# Patient Record
Sex: Female | Born: 1980 | Race: White | Hispanic: No | Marital: Married | Smoking: Never smoker
Health system: Southern US, Community
[De-identification: ages and names within clinical notes are randomized; demographics above are authoritative.]

## PROBLEM LIST (undated history)

## (undated) DIAGNOSIS — R51 Headache: Secondary | ICD-10-CM

## (undated) DIAGNOSIS — I10 Essential (primary) hypertension: Secondary | ICD-10-CM

## (undated) DIAGNOSIS — N970 Female infertility associated with anovulation: Secondary | ICD-10-CM

## (undated) HISTORY — PX: OTHER SURGICAL HISTORY: SHX169

## (undated) HISTORY — PX: TONSILLECTOMY: SUR1361

---
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
## 1999-11-18 ENCOUNTER — Encounter: Payer: Self-pay | Admitting: Emergency Medicine

## 1999-11-18 ENCOUNTER — Emergency Department (HOSPITAL_COMMUNITY): Admission: EM | Admit: 1999-11-18 | Discharge: 1999-11-18 | Payer: Self-pay | Admitting: Emergency Medicine

## 2000-10-13 ENCOUNTER — Emergency Department (HOSPITAL_COMMUNITY): Admission: EM | Admit: 2000-10-13 | Discharge: 2000-10-13 | Payer: Self-pay | Admitting: Emergency Medicine

## 2001-06-05 ENCOUNTER — Ambulatory Visit (HOSPITAL_COMMUNITY): Admission: RE | Admit: 2001-06-05 | Discharge: 2001-06-05 | Payer: Self-pay | Admitting: Family Medicine

## 2001-06-05 ENCOUNTER — Encounter: Payer: Self-pay | Admitting: Family Medicine

## 2001-07-02 ENCOUNTER — Other Ambulatory Visit: Admission: RE | Admit: 2001-07-02 | Discharge: 2001-07-02 | Payer: Self-pay | Admitting: Family Medicine

## 2001-08-09 ENCOUNTER — Encounter: Payer: Self-pay | Admitting: Family Medicine

## 2001-08-09 ENCOUNTER — Ambulatory Visit (HOSPITAL_COMMUNITY): Admission: RE | Admit: 2001-08-09 | Discharge: 2001-08-09 | Payer: Self-pay | Admitting: Family Medicine

## 2001-12-26 ENCOUNTER — Inpatient Hospital Stay (HOSPITAL_COMMUNITY): Admission: AD | Admit: 2001-12-26 | Discharge: 2001-12-28 | Payer: Self-pay | Admitting: Family Medicine

## 2004-07-16 ENCOUNTER — Other Ambulatory Visit: Admission: RE | Admit: 2004-07-16 | Discharge: 2004-07-16 | Payer: Self-pay | Admitting: Family Medicine

## 2004-12-31 ENCOUNTER — Emergency Department (HOSPITAL_COMMUNITY): Admission: EM | Admit: 2004-12-31 | Discharge: 2005-01-01 | Payer: Self-pay | Admitting: Emergency Medicine

## 2005-01-06 ENCOUNTER — Emergency Department (HOSPITAL_COMMUNITY): Admission: EM | Admit: 2005-01-06 | Discharge: 2005-01-06 | Payer: Self-pay | Admitting: Emergency Medicine

## 2005-08-08 ENCOUNTER — Emergency Department (HOSPITAL_COMMUNITY): Admission: EM | Admit: 2005-08-08 | Discharge: 2005-08-08 | Payer: Self-pay | Admitting: Emergency Medicine

## 2005-08-09 ENCOUNTER — Emergency Department (HOSPITAL_COMMUNITY): Admission: EM | Admit: 2005-08-09 | Discharge: 2005-08-09 | Payer: Self-pay | Admitting: Emergency Medicine

## 2005-11-10 ENCOUNTER — Emergency Department (HOSPITAL_COMMUNITY): Admission: EM | Admit: 2005-11-10 | Discharge: 2005-11-10 | Payer: Self-pay | Admitting: Family Medicine

## 2005-11-18 ENCOUNTER — Emergency Department (HOSPITAL_COMMUNITY): Admission: EM | Admit: 2005-11-18 | Discharge: 2005-11-18 | Payer: Self-pay | Admitting: Family Medicine

## 2005-12-25 ENCOUNTER — Emergency Department (HOSPITAL_COMMUNITY): Admission: EM | Admit: 2005-12-25 | Discharge: 2005-12-26 | Payer: Self-pay | Admitting: Emergency Medicine

## 2006-02-08 ENCOUNTER — Ambulatory Visit (HOSPITAL_BASED_OUTPATIENT_CLINIC_OR_DEPARTMENT_OTHER): Admission: RE | Admit: 2006-02-08 | Discharge: 2006-02-09 | Payer: Self-pay | Admitting: Otolaryngology

## 2006-02-08 ENCOUNTER — Encounter (INDEPENDENT_AMBULATORY_CARE_PROVIDER_SITE_OTHER): Payer: Self-pay | Admitting: Specialist

## 2006-03-20 ENCOUNTER — Other Ambulatory Visit: Admission: RE | Admit: 2006-03-20 | Discharge: 2006-03-20 | Payer: Self-pay | Admitting: Gynecology

## 2006-06-09 ENCOUNTER — Emergency Department (HOSPITAL_COMMUNITY): Admission: EM | Admit: 2006-06-09 | Discharge: 2006-06-09 | Payer: Self-pay | Admitting: Emergency Medicine

## 2006-07-06 ENCOUNTER — Emergency Department (HOSPITAL_COMMUNITY): Admission: EM | Admit: 2006-07-06 | Discharge: 2006-07-06 | Payer: Self-pay | Admitting: Emergency Medicine

## 2006-07-06 ENCOUNTER — Emergency Department (HOSPITAL_COMMUNITY): Admission: EM | Admit: 2006-07-06 | Discharge: 2006-07-06 | Payer: Self-pay | Admitting: Family Medicine

## 2006-10-31 ENCOUNTER — Emergency Department (HOSPITAL_COMMUNITY): Admission: EM | Admit: 2006-10-31 | Discharge: 2006-10-31 | Payer: Self-pay | Admitting: Emergency Medicine

## 2006-11-22 ENCOUNTER — Emergency Department (HOSPITAL_COMMUNITY): Admission: EM | Admit: 2006-11-22 | Discharge: 2006-11-22 | Payer: Self-pay | Admitting: Family Medicine

## 2006-12-03 ENCOUNTER — Emergency Department (HOSPITAL_COMMUNITY): Admission: EM | Admit: 2006-12-03 | Discharge: 2006-12-03 | Payer: Self-pay | Admitting: Family Medicine

## 2007-03-15 ENCOUNTER — Other Ambulatory Visit: Admission: RE | Admit: 2007-03-15 | Discharge: 2007-03-15 | Payer: Self-pay | Admitting: Family Medicine

## 2007-07-13 ENCOUNTER — Emergency Department (HOSPITAL_COMMUNITY): Admission: EM | Admit: 2007-07-13 | Discharge: 2007-07-13 | Payer: Self-pay | Admitting: Family Medicine

## 2007-09-08 ENCOUNTER — Emergency Department (HOSPITAL_COMMUNITY): Admission: EM | Admit: 2007-09-08 | Discharge: 2007-09-08 | Payer: Self-pay | Admitting: Emergency Medicine

## 2007-10-29 ENCOUNTER — Emergency Department (HOSPITAL_COMMUNITY): Admission: EM | Admit: 2007-10-29 | Discharge: 2007-10-29 | Payer: Self-pay | Admitting: Family Medicine

## 2007-10-30 ENCOUNTER — Emergency Department (HOSPITAL_COMMUNITY): Admission: EM | Admit: 2007-10-30 | Discharge: 2007-10-30 | Payer: Self-pay | Admitting: Emergency Medicine

## 2007-10-30 ENCOUNTER — Ambulatory Visit (HOSPITAL_COMMUNITY): Admission: RE | Admit: 2007-10-30 | Discharge: 2007-10-30 | Payer: Self-pay | Admitting: Family Medicine

## 2008-01-21 ENCOUNTER — Encounter: Admission: RE | Admit: 2008-01-21 | Discharge: 2008-01-21 | Payer: Self-pay | Admitting: Family Medicine

## 2008-03-07 ENCOUNTER — Emergency Department (HOSPITAL_COMMUNITY): Admission: EM | Admit: 2008-03-07 | Discharge: 2008-03-07 | Payer: Self-pay | Admitting: Emergency Medicine

## 2008-03-12 ENCOUNTER — Emergency Department (HOSPITAL_COMMUNITY): Admission: EM | Admit: 2008-03-12 | Discharge: 2008-03-12 | Payer: Self-pay | Admitting: Emergency Medicine

## 2008-08-17 ENCOUNTER — Emergency Department (HOSPITAL_COMMUNITY): Admission: EM | Admit: 2008-08-17 | Discharge: 2008-08-17 | Payer: Self-pay | Admitting: Emergency Medicine

## 2008-10-24 ENCOUNTER — Emergency Department (HOSPITAL_COMMUNITY): Admission: EM | Admit: 2008-10-24 | Discharge: 2008-10-24 | Payer: Self-pay | Admitting: Emergency Medicine

## 2009-01-22 ENCOUNTER — Emergency Department (HOSPITAL_COMMUNITY): Admission: EM | Admit: 2009-01-22 | Discharge: 2009-01-22 | Payer: Self-pay | Admitting: Family Medicine

## 2009-03-13 ENCOUNTER — Emergency Department (HOSPITAL_COMMUNITY): Admission: EM | Admit: 2009-03-13 | Discharge: 2009-03-13 | Payer: Self-pay | Admitting: Family Medicine

## 2009-05-22 ENCOUNTER — Emergency Department (HOSPITAL_COMMUNITY): Admission: EM | Admit: 2009-05-22 | Discharge: 2009-05-23 | Payer: Self-pay | Admitting: Emergency Medicine

## 2009-07-16 ENCOUNTER — Emergency Department (HOSPITAL_COMMUNITY): Admission: EM | Admit: 2009-07-16 | Discharge: 2009-07-17 | Payer: Self-pay | Admitting: Emergency Medicine

## 2009-10-08 ENCOUNTER — Encounter: Admission: RE | Admit: 2009-10-08 | Discharge: 2009-10-08 | Payer: Self-pay | Admitting: Obstetrics and Gynecology

## 2010-01-03 NOTE — L&D Delivery Note (Addendum)
Twin A is FEMALE --  Twin B is FEMALE

## 2010-01-24 ENCOUNTER — Encounter: Payer: Self-pay | Admitting: Orthopedic Surgery

## 2010-03-20 LAB — COMPREHENSIVE METABOLIC PANEL
AST: 20 U/L (ref 0–37)
Albumin: 4.1 g/dL (ref 3.5–5.2)
Alkaline Phosphatase: 76 U/L (ref 39–117)
BUN: 10 mg/dL (ref 6–23)
CO2: 23 mEq/L (ref 19–32)
Creatinine, Ser: 0.77 mg/dL (ref 0.4–1.2)
GFR calc non Af Amer: >60 mL/min (ref >60)
Glucose, Bld: 94 mg/dL (ref 70–99)
Potassium: 3.5 mEq/L (ref 3.5–5.1)
Sodium: 138 mEq/L (ref 135–145)

## 2010-03-20 LAB — CBC
Hemoglobin: 13.6 g/dL (ref 12.0–15.0)
MCH: 31.5 pg (ref 26.0–34.0)
MCHC: 34.5 g/dL (ref 30.0–36.0)
RBC: 4.3 MIL/uL (ref 3.87–5.11)

## 2010-03-20 LAB — HEMOCCULT GUIAC POC 1CARD (OFFICE): Fecal Occult Bld: POSITIVE

## 2010-03-20 LAB — DIFFERENTIAL
Eosinophils Absolute: 0.1 10*3/uL (ref 0.0–0.7)
Eosinophils Relative: 1 % (ref 0–5)
Lymphocytes Relative: 23 % (ref 12–46)
Lymphs Abs: 1.9 10*3/uL (ref 0.7–4.0)
Monocytes Relative: 6 % (ref 3–12)
Neutrophils Relative %: 69 % (ref 43–77)

## 2010-03-22 LAB — CBC
HCT: 38 % (ref 36.0–46.0)
Hemoglobin: 13 g/dL (ref 12.0–15.0)
RBC: 4.16 MIL/uL (ref 3.87–5.11)
WBC: 6.7 10*3/uL (ref 4.0–10.5)

## 2010-03-22 LAB — COMPREHENSIVE METABOLIC PANEL
AST: 18 U/L (ref 0–37)
Albumin: 4 g/dL (ref 3.5–5.2)
Alkaline Phosphatase: 73 U/L (ref 39–117)
CO2: 28 mEq/L (ref 19–32)
Calcium: 9.1 mg/dL (ref 8.4–10.5)
Chloride: 103 mEq/L (ref 96–112)
Creatinine, Ser: 0.89 mg/dL (ref 0.4–1.2)
GFR calc non Af Amer: >60 mL/min (ref >60)
Glucose, Bld: 103 mg/dL — ABNORMAL HIGH (ref 70–99)
Potassium: 4.1 mEq/L (ref 3.5–5.1)
Total Protein: 7.1 g/dL (ref 6.0–8.3)

## 2010-03-22 LAB — URINALYSIS, ROUTINE W REFLEX MICROSCOPIC
Bilirubin Urine: NEGATIVE
Ketones, ur: NEGATIVE mg/dL
Nitrite: NEGATIVE
Urobilinogen, UA: 0.2 mg/dL (ref 0.0–1.0)

## 2010-03-22 LAB — DIFFERENTIAL
Basophils Absolute: 0 10*3/uL (ref 0.0–0.1)
Basophils Relative: 0 % (ref 0–1)
Eosinophils Absolute: 0.1 10*3/uL (ref 0.0–0.7)
Eosinophils Relative: 1 % (ref 0–5)
Lymphocytes Relative: 41 % (ref 12–46)
Monocytes Absolute: 0.4 10*3/uL (ref 0.1–1.0)

## 2010-03-28 LAB — POCT PREGNANCY, URINE: Preg Test, Ur: NEGATIVE

## 2010-04-05 ENCOUNTER — Other Ambulatory Visit (HOSPITAL_COMMUNITY): Payer: Self-pay | Admitting: Obstetrics and Gynecology

## 2010-04-05 ENCOUNTER — Other Ambulatory Visit (HOSPITAL_COMMUNITY): Payer: Self-pay | Admitting: Obstetrics & Gynecology

## 2010-04-05 DIAGNOSIS — N979 Female infertility, unspecified: Secondary | ICD-10-CM

## 2010-04-08 LAB — POCT URINALYSIS DIP (DEVICE): Hgb urine dipstick: NEGATIVE

## 2010-04-08 LAB — POCT PREGNANCY, URINE: Preg Test, Ur: NEGATIVE

## 2010-04-13 ENCOUNTER — Ambulatory Visit (HOSPITAL_COMMUNITY)
Admission: RE | Admit: 2010-04-13 | Discharge: 2010-04-13 | Disposition: A | Discharge status: Home / Self Care | Payer: 59 | Source: Ambulatory Visit | Attending: Obstetrics & Gynecology | Admitting: Obstetrics & Gynecology

## 2010-04-13 DIAGNOSIS — N979 Female infertility, unspecified: Secondary | ICD-10-CM | POA: Insufficient documentation

## 2010-04-26 ENCOUNTER — Inpatient Hospital Stay (INDEPENDENT_AMBULATORY_CARE_PROVIDER_SITE_OTHER)
Admission: RE | Admit: 2010-04-26 | Discharge: 2010-04-26 | Disposition: A | Discharge status: Home / Self Care | Payer: 59 | Source: Ambulatory Visit

## 2010-04-26 DIAGNOSIS — J019 Acute sinusitis, unspecified: Secondary | ICD-10-CM

## 2010-04-26 DIAGNOSIS — J029 Acute pharyngitis, unspecified: Secondary | ICD-10-CM

## 2010-04-26 LAB — POCT RAPID STREP A (OFFICE): Streptococcus, Group A Screen (Direct): NEGATIVE

## 2010-05-21 NOTE — Op Note (Signed)
NAME:  [name], [name] NO.:  [id number]   MEDICAL RECORD NO.:  [id number]          PATIENT TYPE:  AMB   LOCATION:  DSC                          FACILITY:  [facility]   PHYSICIAN:  Antony Contras, MD     DATE OF BIRTH:  [birth year]   DATE OF PROCEDURE:  02/08/2006  DATE OF DISCHARGE:  02/09/2006                               OPERATIVE REPORT   PREOPERATIVE DIAGNOSIS:  Chronic tonsillitis.   POSTOPERATIVE DIAGNOSIS:  Chronic tonsillitis.   PROCEDURE PERFORMED:  Tonsillectomy.   SURGEON:  Antony Contras, MD.   ANESTHESIA:  General endotracheal anesthesia.   COMPLICATIONS:  None.   INDICATIONS FOR PROCEDURE:  The patient is a 30 year old white female  who has had recurrent tonsil infections for the past five years that  number about five times per year.  She recently had another infection  and presents to the operating room for surgical management.   FINDINGS:  Tonsils are 1 to 2+ in size, and not overtly inflamed today.   DESCRIPTION OF PROCEDURE:  The patient was identified in the holding  room and, informed consent having been obtained from the patient, the  patient was moved to the operative suite and put onto the table in the  supine position.  Anesthesia was induced and the patient was intubated  by the anesthesia team without difficulty.  The patient was given  intravenous antibiotics and steroids during the case.  The eyes were  taped closed, and the bed was turned 90 degrees from anesthesia.  A head  wrap was placed around the patient's head.  The oropharynx was then  exposed using a Crowe-Davis retractor that was placed in suspension on  the Mayo stand.  The right tonsil was grasped with a curved Allis and  retracted medially.  A curvilinear incision was made along the anterior  tonsillar pillar using the Coblator on a setting of 7 and 3.  This was  continued in the sub capsular plane until a tonsil was removed.  This  was passed to nursing for pathology.   The same procedure was then  carried on the left side.  The tonsils were passed separately for  pathology.   Suction cautery on a setting of 35 was then used to cauterize bleeding  vessels in both tonsillar fossae as well as superficial vessels.  After  this was completed, the throat and nose were copiously irrigated with  saline and a suction  catheter was passed down the esophagus to suck out the stomach and  esophagus.  The retractor was then taken out of suspension and removed  from the patient's mouth.  She was then turned back to anesthesia,  awakened, and was extubated and moved to the recovery room in stable  condition.      Antony Contras, MD  Electronically Signed     DDB/MEDQ  D:  02/08/2006  T:  02/09/2006  Job:  [id number]

## 2010-07-01 ENCOUNTER — Observation Stay (HOSPITAL_COMMUNITY): Payer: 59

## 2010-07-01 ENCOUNTER — Observation Stay (HOSPITAL_COMMUNITY)
Admission: AD | Admit: 2010-07-01 | Discharge: 2010-07-01 | Disposition: A | Discharge status: Home / Self Care | Payer: 59 | Source: Ambulatory Visit | Attending: Obstetrics and Gynecology | Admitting: Obstetrics and Gynecology

## 2010-07-01 DIAGNOSIS — O30049 Twin pregnancy, dichorionic/diamniotic, unspecified trimester: Secondary | ICD-10-CM | POA: Insufficient documentation

## 2010-07-01 DIAGNOSIS — O210 Mild hyperemesis gravidarum: Principal | ICD-10-CM | POA: Insufficient documentation

## 2010-07-01 DIAGNOSIS — O30009 Twin pregnancy, unspecified number of placenta and unspecified number of amniotic sacs, unspecified trimester: Secondary | ICD-10-CM | POA: Insufficient documentation

## 2010-07-01 LAB — URINALYSIS, ROUTINE W REFLEX MICROSCOPIC
Bilirubin Urine: NEGATIVE
Nitrite: NEGATIVE
Specific Gravity, Urine: >1.03 — ABNORMAL HIGH (ref 1.005–1.030)
Urobilinogen, UA: 0.2 mg/dL (ref 0.0–1.0)

## 2010-07-07 ENCOUNTER — Inpatient Hospital Stay (HOSPITAL_COMMUNITY)
Admission: AD | Admit: 2010-07-07 | Discharge: 2010-07-07 | Disposition: A | Discharge status: Home / Self Care | Payer: 59 | Source: Ambulatory Visit | Attending: Obstetrics & Gynecology | Admitting: Obstetrics & Gynecology

## 2010-07-07 ENCOUNTER — Inpatient Hospital Stay (HOSPITAL_COMMUNITY): Payer: 59

## 2010-07-07 DIAGNOSIS — O30049 Twin pregnancy, dichorionic/diamniotic, unspecified trimester: Secondary | ICD-10-CM | POA: Insufficient documentation

## 2010-07-07 DIAGNOSIS — O30009 Twin pregnancy, unspecified number of placenta and unspecified number of amniotic sacs, unspecified trimester: Secondary | ICD-10-CM | POA: Insufficient documentation

## 2010-07-07 DIAGNOSIS — O210 Mild hyperemesis gravidarum: Secondary | ICD-10-CM | POA: Insufficient documentation

## 2010-07-07 LAB — COMPREHENSIVE METABOLIC PANEL
Albumin: 3.2 g/dL — ABNORMAL LOW (ref 3.5–5.2)
Alkaline Phosphatase: 60 U/L (ref 39–117)
BUN: 7 mg/dL (ref 6–23)
Calcium: 9 mg/dL (ref 8.4–10.5)
Potassium: 3.9 mEq/L (ref 3.5–5.1)
Sodium: 135 mEq/L (ref 135–145)
Total Protein: 7 g/dL (ref 6.0–8.3)

## 2010-07-07 LAB — URINALYSIS, ROUTINE W REFLEX MICROSCOPIC
Glucose, UA: NEGATIVE mg/dL
Ketones, ur: NEGATIVE mg/dL
Leukocytes, UA: NEGATIVE
Specific Gravity, Urine: 1.015 (ref 1.005–1.030)
pH: 6 (ref 5.0–8.0)

## 2010-07-08 LAB — ABO/RH: "RH Type ": POSITIVE

## 2010-07-08 LAB — RUBELLA ANTIBODY, IGM: Rubella: IMMUNE

## 2010-07-08 LAB — HEPATITIS B SURFACE ANTIGEN: Hepatitis B Surface Ag: NEGATIVE

## 2010-07-08 LAB — SYPHILIS: RPR W/REFLEX TO RPR TITER AND TREPONEMAL ANTIBODIES, TRADITIONAL SCREENING AND DIAGNOSIS ALGORITHM: RPR: NONREACTIVE

## 2010-07-09 ENCOUNTER — Other Ambulatory Visit: Payer: Self-pay | Admitting: Obstetrics & Gynecology

## 2010-07-09 NOTE — H&P (Signed)
  NAMEMICHEALE, SCHLACK NO.:  [id number]  MEDICAL RECORD NO.:  [id number]  LOCATION:                                 FACILITY:  PHYSICIAN:  Lenoard Aden, M.D.DATE OF BIRTH:  [birth year]  DATE OF ADMISSION: DATE OF DISCHARGE:                             HISTORY & PHYSICAL   CHIEF COMPLAINT:  Nausea, vomiting.  HISTORY OF PRESENT ILLNESS:  She is a 30 year old white female G2, P1 with a known twin dichorionic diamniotic gestation who presents with recurrent persistent nausea, vomiting despite p.r.n. medication use.  FAMILY HISTORY:  Breast cancer.  SOCIAL HISTORY:  Nonsmoker, nondrinker.  Denies domestic or physical violence.  OBSTETRICAL HISTORY:  Remarkable for vaginal delivery.  PAST SURGICAL HISTORY:  History of toe amputation, history of tonsillectomy.  ALLERGIES:  TRAMADOL.  MEDICATIONS:  Her home medications include Zofran and Phenergan and Bonine p.r.n.  She has not had any p.o. intake reportedly for 24-48 hours.  Zofran, prenatal vitamins, Bonine, promethazine, and Zantac.  PHYSICAL EXAMINATION:  GENERAL:  She is a well-developed, well-nourished white female in no acute distress. VITAL SIGNS:  Stable.  The patient is afebrile. HEENT:  Normal.  Neck is supple, full range of motion. LUNGS:  Clear. HEART:  Reveals a regular rate rhythm. ABDOMEN:  Soft, gravid, just above pubis, but nontender. PELVIC:  Deferred. EXTREMITIES:  Revealed no cords. NEUROLOGIC:  Nonfocal. SKIN:  Intact.  IMPRESSION: 1. A 11-week twin dichorionic diamniotic gestation, previously done     ultrasound. 2. Refractory hyperemesis.  PLAN:  To admit, IV fluids, IV Zofran, CMP, with discharge pending.     Lenoard Aden, M.D.     RJT/MEDQ  D:  07/07/2010  T:  07/08/2010  Job:  [id number]  Electronically Signed by Olivia Mackie M.D. on 07/09/2010 07:37:26 AM

## 2010-07-09 NOTE — H&P (Signed)
  [name], [name] NO.:  [id number]  MEDICAL RECORD NO.:  [id number]  LOCATION:                                 FACILITY:  PHYSICIAN:  Lenoard Aden, M.D.DATE OF BIRTH:  [birth year]  DATE OF ADMISSION:  07/01/2010 DATE OF DISCHARGE:                             HISTORY & PHYSICAL   CHIEF COMPLAINT:  Nausea and vomiting.  She is a 30 year old white female, G2, P1 with a known twin di-di pregnancy, who presents with nausea, vomiting.  Her medications are Zofran p.r.n., Phenergan p.r.n., and prenatal vitamins.  She is a nonsmoker, nondrinker.  Denies domestic or physical violence.  She has a history of vaginal delivery, history of toe amputation, and history of tonsillectomy.  FAMILY HISTORY:  Breast cancer.  ALLERGIES:  She has an allergy to TRAMADOL as noted.  PHYSICAL EXAM:  GENERAL:  She is a well-developed, well-nourished white female, in no acute distress. HEENT:  Normal. NECK:  Supple.  Full range of motion. LUNGS:  Clear. HEART:  Regular rate and rhythm. ABDOMEN:  Soft, gravid, nontender. PELVIC:  Deferred. EXTREMITIES:  There are no cords. NEUROLOGIC:  Nonfocal. SKIN:  Intact.  IMPRESSION: 1. A 9-week intrauterine pregnancy, documented by my ultrasound today. 2. Hyperemesis.  PLAN:  Proceed with IV fluid, IV Zofran, Phenergan, Zantac p.o. Discharge home after improvement.  Follow up in the office as scheduled.     Lenoard Aden, M.D.    RJT/MEDQ  D:  07/01/2010  T:  07/02/2010  Job:  [id number]  Electronically Signed by Olivia Mackie M.D. on 07/09/2010 07:36:51 AM

## 2010-07-15 ENCOUNTER — Inpatient Hospital Stay (HOSPITAL_COMMUNITY)
Admission: AD | Admit: 2010-07-15 | Discharge: 2010-07-15 | Disposition: A | Discharge status: Home / Self Care | Payer: 59 | Source: Ambulatory Visit | Attending: Obstetrics & Gynecology | Admitting: Obstetrics & Gynecology

## 2010-07-15 ENCOUNTER — Encounter (HOSPITAL_COMMUNITY): Payer: Self-pay

## 2010-07-15 ENCOUNTER — Inpatient Hospital Stay (HOSPITAL_COMMUNITY): Admission: AD | Admit: 2010-07-15 | Payer: 59 | Source: Ambulatory Visit | Admitting: Obstetrics & Gynecology

## 2010-07-15 DIAGNOSIS — O30049 Twin pregnancy, dichorionic/diamniotic, unspecified trimester: Secondary | ICD-10-CM | POA: Insufficient documentation

## 2010-07-15 DIAGNOSIS — O210 Mild hyperemesis gravidarum: Secondary | ICD-10-CM | POA: Insufficient documentation

## 2010-07-15 DIAGNOSIS — O30009 Twin pregnancy, unspecified number of placenta and unspecified number of amniotic sacs, unspecified trimester: Secondary | ICD-10-CM | POA: Insufficient documentation

## 2010-07-15 HISTORY — DX: Female infertility associated with anovulation: N97.0

## 2010-07-15 MED ORDER — KCL-LACTATED RINGERS-D5W 20 MEQ/L IV SOLN
Freq: Once | INTRAVENOUS | Status: AC
  Administered 2010-07-15: 15:00:00 via INTRAVENOUS
  Filled 2010-07-15 (×2): qty 1000

## 2010-07-15 MED ORDER — SODIUM CHLORIDE 0.9 % IV SOLN: 1.0000 mg | Freq: Once | INTRAVENOUS | Status: DC

## 2010-07-15 MED ORDER — M.V.I. ADULT IV INJ: 10.0000 mL | Freq: Once | INTRAVENOUS | Status: DC

## 2010-07-15 MED ORDER — THIAMINE HCL 100 MG/ML IJ SOLN
Freq: Once | INTRAVENOUS | Status: AC
  Administered 2010-07-15: 19:00:00 via INTRAVENOUS
  Filled 2010-07-15: qty 1000

## 2010-07-15 MED ORDER — METOCLOPRAMIDE HCL 5 MG/ML IJ SOLN
10.0000 mg | Freq: Once | INTRAMUSCULAR | Status: AC
  Administered 2010-07-15: 10 mg via INTRAVENOUS
  Filled 2010-07-15: qty 2

## 2010-07-15 MED ORDER — THIAMINE HCL 100 MG/ML IJ SOLN: 100.0000 mg | Freq: Once | INTRAMUSCULAR | Status: DC

## 2010-07-15 NOTE — Progress Notes (Signed)
PT REQUEST TO GO HOME- SAYS SHE FEELS BETTER- AND NEEDS TO GO PICK UP SON AND GO TO PHARMACY.  CALLED DR LAVOIE -  NO ANSWER- CALLED L/D AND OR

## 2010-07-15 NOTE — ED Provider Notes (Signed)
History     Chief Complaint  Patient presents with  . Hyperemesis Gravidarum   HPI  10 wks with [name] twins, trying all meds for hyperemesis, not able to eat/drink x 48 hrs, everything is coming back out, no void since > 6-8 hrs. Feels tired, fatigued, HA. Teary. Seen in office and sent for IVF, PO challenge.    Past Medical History  Diagnosis Date  . Infertility associated with anovulation     Past Surgical History  Procedure Date  . Tonsillectomy   . Partial amputation of rt great toe     History reviewed. No pertinent family history.  History  Substance Use Topics  . Smoking status: Never Smoker   . Smokeless tobacco: Never Used  . Alcohol Use: No    Allergies:  Allergies  Allergen Reactions  . Tramadol Nausea And Vomiting    No prescriptions prior to admission    ROS Fatigue. Dizzy, depressed (excited to be preg, but cannot think past n/v)   Physical Exam   Blood pressure 102/78, pulse 84, temperature 98.2 F (36.8 C), temperature source Oral, resp. rate 18, height 5\' 2"  (1.575 m), weight 79.017 kg (174 lb 3.2 oz), last menstrual period 04/05/2010.  Physical Exam HEENT neg, A&O x 3 Lungs CTA b/l CV RRR Abdo soft, NABS.  Extr no c/c/e FHT x 2 in office.   MAU Course  Procedures Recvd 10 mg IV Reglan, 1 lit D5LR +20K, and 1 lit LR w/ vitamins (Banana bag). Able to tolerate crackers and juice. Feels much better and will dc home  Rx Reglan 10 mg ODT given. Cont all other meds and cut back on Zofran due to constipation.   Mary Telleria, MD

## 2010-07-15 NOTE — Progress Notes (Signed)
Sent from MD office for hyperemesis, was here last week, to receive ivfluids. Denies diarrhea, no bleeding or vag d/c changes

## 2010-07-15 NOTE — Progress Notes (Signed)
DR Seymour Bars IN MEETING- CALLED DR MODY- SHE'S COMING TO SEE PT.

## 2010-07-15 NOTE — ED Notes (Signed)
Feeling better.  IV infusing well, no complaints of  Pain at site. No redness or swelling at site.  Warm blanket given.  Call light remains in reach.

## 2010-08-16 ENCOUNTER — Inpatient Hospital Stay (HOSPITAL_COMMUNITY)
Admission: AD | Admit: 2010-08-16 | Discharge: 2010-08-17 | Disposition: A | Discharge status: Home / Self Care | Payer: 59 | Source: Ambulatory Visit | Attending: Obstetrics and Gynecology | Admitting: Obstetrics and Gynecology

## 2010-08-16 ENCOUNTER — Encounter (HOSPITAL_COMMUNITY): Payer: Self-pay | Admitting: *Deleted

## 2010-08-16 DIAGNOSIS — O210 Mild hyperemesis gravidarum: Secondary | ICD-10-CM | POA: Insufficient documentation

## 2010-08-16 DIAGNOSIS — G43909 Migraine, unspecified, not intractable, without status migrainosus: Secondary | ICD-10-CM | POA: Insufficient documentation

## 2010-08-16 DIAGNOSIS — O30009 Twin pregnancy, unspecified number of placenta and unspecified number of amniotic sacs, unspecified trimester: Secondary | ICD-10-CM | POA: Insufficient documentation

## 2010-08-16 LAB — URINALYSIS, ROUTINE W REFLEX MICROSCOPIC
Bilirubin Urine: NEGATIVE
Leukocytes, UA: NEGATIVE
Nitrite: NEGATIVE
Specific Gravity, Urine: >1.03 — ABNORMAL HIGH (ref 1.005–1.030)
Urobilinogen, UA: 0.2 mg/dL (ref 0.0–1.0)
pH: 6 (ref 5.0–8.0)

## 2010-08-16 NOTE — Progress Notes (Signed)
VOMITING- AND NOW CRAMPS

## 2010-08-16 NOTE — Progress Notes (Signed)
SAYS WAS NOT SICK YESTERDAY AND THIS MORNIN- WENT TO WORK-   AT 1130-  AT WORK-  STARTED VOMITING-  WORKED  TILL 3PM..  DID NOT TAKE ANY MED.  NOW HAS BAD H/A.   CALLED ON CALL PERSON- TOLD TO COME HERE.

## 2010-08-17 LAB — URINALYSIS, ROUTINE W REFLEX MICROSCOPIC
Bilirubin Urine: NEGATIVE
Ketones, ur: NEGATIVE mg/dL
Leukocytes, UA: NEGATIVE
Nitrite: NEGATIVE
Nitrite: NEGATIVE
Protein, ur: NEGATIVE mg/dL
Protein, ur: NEGATIVE mg/dL
Specific Gravity, Urine: 1.02 (ref 1.005–1.030)
Urobilinogen, UA: 0.2 mg/dL (ref 0.0–1.0)
Urobilinogen, UA: 0.2 mg/dL (ref 0.0–1.0)
pH: 7 (ref 5.0–8.0)

## 2010-08-17 LAB — URINE MICROSCOPIC-ADD ON

## 2010-08-17 MED ORDER — SODIUM CHLORIDE 0.9 % IV SOLN
8.0000 mg | Freq: Once | INTRAVENOUS | Status: AC
  Administered 2010-08-17: 8 mg via INTRAVENOUS
  Filled 2010-08-17: qty 4

## 2010-08-17 MED ORDER — LACTATED RINGERS IV SOLN
INTRAVENOUS | Status: DC
  Administered 2010-08-17: 02:00:00 via INTRAVENOUS

## 2010-08-17 MED ORDER — HYDROMORPHONE HCL 1 MG/ML IJ SOLN
2.0000 mg | Freq: Once | INTRAMUSCULAR | Status: AC
  Administered 2010-08-17: 2 mg via INTRAVENOUS
  Filled 2010-08-17: qty 2

## 2010-08-17 MED ORDER — DEXTROSE 5 % IN LACTATED RINGERS IV BOLUS
1000.0000 mL | Freq: Once | INTRAVENOUS | Status: AC
  Administered 2010-08-17: 1000 mL via INTRAVENOUS

## 2010-08-17 NOTE — ED Provider Notes (Signed)
History     No chief complaint on file. cc: N/V, headache  HPI 30 yo P1 MWF now @ [redacted] wk gestation with twins presents for c/o N/V since 11: 30 am. (+) h/a w/ light sensitivity. (+) hx migraine. PNC complicated by N/V however pt has not used any med x 1 wk. No assoc fever , diarrhea or family member ill  OB History    Grav Para Term Preterm Abortions TAB SAB Ect Mult Living   2 1 1  0 0 0 0 0 0 1      Past Medical History  Diagnosis Date  . Infertility associated with anovulation     Past Surgical History  Procedure Date  . Tonsillectomy   . Partial amputation of rt great toe     History reviewed. No pertinent family history.  History  Substance Use Topics  . Smoking status: Never Smoker   . Smokeless tobacco: Never Used  . Alcohol Use: No    Allergies:  Allergies  Allergen Reactions  . Tramadol Nausea And Vomiting    Prescriptions prior to admission  Medication Sig Dispense Refill  . acetaminophen (TYLENOL) 500 MG tablet Take 1,000 mg by mouth every 6 (six) hours as needed. For headache       . cyclobenzaprine (FLEXERIL) 5 MG tablet Take 5 mg by mouth 2 (two) times daily as needed. For muscle spasms       . docusate sodium (COLACE) 100 MG capsule Take 100 mg by mouth 2 (two) times daily as needed. For constipation      . Meclizine HCl (BONINE PO) Take 1 tablet by mouth as needed. For nausea      . ondansetron (ZOFRAN) 4 MG tablet Take 4 mg by mouth every 8 (eight) hours as needed. For nausea       . polyethylene glycol (MIRALAX / GLYCOLAX) packet Take 17 g by mouth daily as needed. For constipation      . prenatal vitamin w/FE, FA (PRENATAL 1 + 1) 27-1 MG TABS Take 1 tablet by mouth daily.        . promethazine (PHENERGAN) 25 MG tablet Take 25 mg by mouth every 6 (six) hours as needed. For nausea       . pyridOXINE (B-6) 50 MG tablet Take 50 mg by mouth daily.        Marland Kitchen omeprazole (PRILOSEC OTC) 20 MG tablet Take 20 mg by mouth daily. Antiacid, reduce nausea          Review of Systems  Constitutional: Negative for fever.  Gastrointestinal: Positive for nausea and vomiting. Negative for abdominal pain and diarrhea.  Genitourinary: Negative.   Neurological: Positive for headaches.   Physical Exam   Blood pressure 120/79, pulse 86, temperature 98.1 F (36.7 C), resp. rate 16, height 5' (1.524 m), weight 79.493 kg (175 lb 4 oz), last menstrual period 04/05/2010.  Physical Exam  Constitutional: She appears well-developed and well-nourished.  Eyes: EOM are normal.  Neck: Neck supple.  Cardiovascular: Regular rhythm.   Respiratory: Breath sounds normal.  GI: Soft.       Gravid nt  Skin: Skin is warm and dry.    MAU Course  Procedures U/a: 40 ketones MDM  Assessment and Plan  N/V pregnancy Migraine Twin gestation  P) IV fluids. Dilaudid IV for h/a, hydrate, Zofran  Rein Popov A 08/17/2010, 12:53 AM 30 yo G

## 2010-08-25 ENCOUNTER — Inpatient Hospital Stay (HOSPITAL_COMMUNITY)
Admission: AD | Admit: 2010-08-25 | Discharge: 2010-08-25 | Disposition: A | Discharge status: Home / Self Care | Payer: 59 | Source: Ambulatory Visit | Attending: Obstetrics | Admitting: Obstetrics

## 2010-08-25 ENCOUNTER — Encounter (HOSPITAL_COMMUNITY): Payer: Self-pay

## 2010-08-25 DIAGNOSIS — O99891 Other specified diseases and conditions complicating pregnancy: Secondary | ICD-10-CM | POA: Insufficient documentation

## 2010-08-25 DIAGNOSIS — O211 Hyperemesis gravidarum with metabolic disturbance: Secondary | ICD-10-CM | POA: Insufficient documentation

## 2010-08-25 DIAGNOSIS — G43909 Migraine, unspecified, not intractable, without status migrainosus: Secondary | ICD-10-CM | POA: Insufficient documentation

## 2010-08-25 DIAGNOSIS — E860 Dehydration: Secondary | ICD-10-CM | POA: Insufficient documentation

## 2010-08-25 HISTORY — DX: Headache: R51

## 2010-08-25 LAB — COMPREHENSIVE METABOLIC PANEL
AST: 12 U/L (ref 0–37)
Albumin: 3.1 g/dL — ABNORMAL LOW (ref 3.5–5.2)
BUN: 6 mg/dL (ref 6–23)
Calcium: 8.8 mg/dL (ref 8.4–10.5)
Chloride: 98 mEq/L (ref 96–112)
Creatinine, Ser: 0.49 mg/dL — ABNORMAL LOW (ref 0.50–1.10)
Total Bilirubin: 0.2 mg/dL — ABNORMAL LOW (ref 0.3–1.2)

## 2010-08-25 LAB — URINALYSIS, ROUTINE W REFLEX MICROSCOPIC
Glucose, UA: NEGATIVE mg/dL
Leukocytes, UA: NEGATIVE
Protein, ur: NEGATIVE mg/dL
Specific Gravity, Urine: 1.025 (ref 1.005–1.030)
Urobilinogen, UA: 0.2 mg/dL (ref 0.0–1.0)

## 2010-08-25 MED ORDER — DEXTROSE IN LACTATED RINGERS 5 % IV SOLN: INTRAVENOUS | Status: DC

## 2010-08-25 MED ORDER — M.V.I. ADULT IV INJ
10.0000 mL | Freq: Once | INTRAVENOUS | Status: AC
  Administered 2010-08-25: 10 mL via INTRAVENOUS
  Filled 2010-08-25: qty 10

## 2010-08-25 MED ORDER — PROMETHAZINE HCL 25 MG/ML IJ SOLN
25.0000 mg | Freq: Once | INTRAMUSCULAR | Status: AC
  Administered 2010-08-25: 25 mg via INTRAVENOUS
  Filled 2010-08-25: qty 1

## 2010-08-25 MED ORDER — BUTALBITAL-APAP-CAFFEINE 50-325-40 MG PO TABS
2.0000 | ORAL_TABLET | Freq: Once | ORAL | Status: AC
  Administered 2010-08-25: 2 via ORAL
  Filled 2010-08-25: qty 2

## 2010-08-25 MED ORDER — DEXTROSE 5 % IN LACTATED RINGERS IV BOLUS
1000.0000 mL | Freq: Once | INTRAVENOUS | Status: AC
  Administered 2010-08-25: 1000 mL via INTRAVENOUS

## 2010-08-25 NOTE — Progress Notes (Signed)
Dr. Ernestina Penna at bedside, orders placed for fioricet 2 tablets now, pt voices understanding to d/c once second liter of ivf is completed.

## 2010-08-25 NOTE — Progress Notes (Signed)
CC: HA  HPI: 30 yo G2P1001 w/ twin pregnancy (Clomid) presents at 16'5 with 2 days of HA that has turned into migraine. This is what has stimulated nausea. Able to eat yest but unable to tol po today. Pt was given Fiorcet for HA and this has helped. Pt did not take any today b/c was planning to go to work but got sent home b/c HA was to bad. Pt notes 5th time in ER as has been very nauseated through this pregnany. BM yest. On meds for GERD, nausea, constipation. Pt is not taking Zofran as much due to constipation.  No CP/ SOB.   Pt notes HA at an 8 right now, some help w/ phenergan but feels 'jumpy.'  PMH: none PSH: tonsills, amputation big toe  All: tramadol  Vitals noted CMP, CBC, UA noted  Gen: well appearing, no distress Abd: soft, NT, ND, no RUQ pain LE: tr edema, NT Peripheral vascualr: slow skin tenting, normal cap refill  A/P: 16 wks preg a/ twin preg, HA c/w migraine and associated N/V/ dehydration - 2 L IVF. Banana bag to help w/ N/V/dehyrdation, hyponatremia - Fiorcet/ rest/ phenergan for HA  D/c home after treatement, f/u in office to further discuss preventitive strategies.

## 2010-08-25 NOTE — Progress Notes (Signed)
Pt has a history of hyperemesis with twin pregnancy. Has had N/V. Orders were called from the office.

## 2010-08-25 NOTE — Progress Notes (Signed)
Pt states migraine x2days, will take 1-2 fioricet as needed, last dose at 2200 last pm. Denies vaginal d/c changes or bleeding. Mild cramping from dehydration. Pregnant with twins.

## 2010-09-10 ENCOUNTER — Inpatient Hospital Stay (HOSPITAL_COMMUNITY)
Admission: AD | Admit: 2010-09-10 | Discharge: 2010-09-10 | Disposition: A | Discharge status: Home / Self Care | Payer: 59 | Source: Ambulatory Visit | Attending: Obstetrics | Admitting: Obstetrics

## 2010-09-10 ENCOUNTER — Encounter (HOSPITAL_COMMUNITY): Payer: Self-pay | Admitting: *Deleted

## 2010-09-10 DIAGNOSIS — Z566 Other physical and mental strain related to work: Secondary | ICD-10-CM

## 2010-09-10 DIAGNOSIS — O9989 Other specified diseases and conditions complicating pregnancy, childbirth and the puerperium: Secondary | ICD-10-CM | POA: Insufficient documentation

## 2010-09-10 DIAGNOSIS — R51 Headache: Secondary | ICD-10-CM | POA: Insufficient documentation

## 2010-09-10 DIAGNOSIS — R339 Retention of urine, unspecified: Secondary | ICD-10-CM | POA: Insufficient documentation

## 2010-09-10 DIAGNOSIS — Z5689 Other problems related to employment: Secondary | ICD-10-CM | POA: Insufficient documentation

## 2010-09-10 DIAGNOSIS — O26819 Pregnancy related exhaustion and fatigue, unspecified trimester: Secondary | ICD-10-CM

## 2010-09-10 LAB — URINALYSIS, ROUTINE W REFLEX MICROSCOPIC
Ketones, ur: NEGATIVE mg/dL
Leukocytes, UA: NEGATIVE
Nitrite: NEGATIVE
Protein, ur: NEGATIVE mg/dL
pH: 7 (ref 5.0–8.0)

## 2010-09-10 MED ORDER — PROMETHAZINE HCL 25 MG/ML IJ SOLN
25.0000 mg | Freq: Once | INTRAMUSCULAR | Status: AC
  Administered 2010-09-10: 25 mg via INTRAVENOUS
  Filled 2010-09-10: qty 1

## 2010-09-10 MED ORDER — DEXTROSE 5 % IN LACTATED RINGERS IV BOLUS
1000.0000 mL | Freq: Once | INTRAVENOUS | Status: AC
  Administered 2010-09-10: 1000 mL via INTRAVENOUS

## 2010-09-10 MED ORDER — ZOLPIDEM TARTRATE 5 MG PO TABS: 5.0000 mg | ORAL_TABLET | Freq: Every evening | ORAL | Status: DC | PRN

## 2010-09-10 NOTE — Progress Notes (Signed)
previoius visits for dehydration, today HA, fatigue, unable to urinate, drinking and eating small amts, office sent here here ,

## 2010-09-10 NOTE — ED Provider Notes (Signed)
History   Chief Complaint  Patient presents with  . Fatigue  . Headache  . Urinary Retention   HPI   G2P1001, at 19 wks with Di-Di twins. Here with c/o HAs, fatigue, weakness, not voided in hours. Migraines with no prior to pregnancy history.  Not able to sleep, anxious and gets stressed easily. Plans to switch role within her job to get less physical and stressful jobs.   Past Medical History  Diagnosis Date  . Infertility associated with anovulation   . Headache     Past Surgical History  Procedure Date  . Tonsillectomy   . Partial amputation of rt great toe     No family history on file.  History  Substance Use Topics  . Smoking status: Never Smoker   . Smokeless tobacco: Never Used  . Alcohol Use: No    Allergies:  Allergies  Allergen Reactions  . Tramadol Nausea And Vomiting    Prescriptions prior to admission  Medication Sig Dispense Refill  . acetaminophen (TYLENOL) 500 MG tablet Take 1,000 mg by mouth every 6 (six) hours as needed. For headache       . butalbital-aspirin-caffeine (BUTALBITAL COMPOUND/ASA) 50-325-40 MG per capsule Take 1-2 capsules by mouth every 4 (four) hours as needed. For pain      . cyclobenzaprine (FLEXERIL) 5 MG tablet Take 5 mg by mouth 2 (two) times daily as needed. For muscle spasms       . docusate sodium (COLACE) 100 MG capsule Take 100 mg by mouth 2 (two) times daily as needed. For constipation      . Meclizine HCl (BONINE PO) Take 1 tablet by mouth as needed. For nausea      . omeprazole (PRILOSEC OTC) 20 MG tablet Take 20 mg by mouth daily. Antiacid, reduce nausea       . ondansetron (ZOFRAN) 4 MG tablet Take 4 mg by mouth every 8 (eight) hours as needed. For nausea       . polyethylene glycol (MIRALAX / GLYCOLAX) packet Take 17 g by mouth daily as needed. For constipation      . prenatal vitamin w/FE, FA (PRENATAL 1 + 1) 27-1 MG TABS Take 1 tablet by mouth daily.        . promethazine (PHENERGAN) 25 MG tablet Take 25 mg by mouth  every 6 (six) hours as needed. For nausea       . pyridOXINE (B-6) 50 MG tablet Take 50 mg by mouth daily.          ROS No UCs/vag blding/loss of fluid. Able to void in MAU/  Physical Exam Blood pressure 114/72, pulse 85, temperature 98.4 F (36.9 C), resp. rate 18, height 5\' 2"  (1.575 m), weight 80.74 kg (178 lb), last menstrual period 04/05/2010. NAD, abdo soft, NT, ND.  MAU Course  Procedures IV hydration, phenergan.   Assessment and Plan  D/c home once better. HA resolved. Plan Ambien to assist with sleep. Declined depression and Psychiatry referral.   Audrinna Sherman R 09/10/2010, 3:29 PM

## 2010-09-10 NOTE — Initial Assessments (Signed)
Pt instructed not to drive after discharge due to medication, verbalizes understanding.

## 2010-09-19 ENCOUNTER — Inpatient Hospital Stay (HOSPITAL_COMMUNITY)
Admit: 2010-09-19 | Discharge: 2010-09-19 | Disposition: A | Discharge status: Home / Self Care | Payer: 59 | Source: Ambulatory Visit | Attending: Obstetrics and Gynecology | Admitting: Obstetrics and Gynecology

## 2010-09-19 DIAGNOSIS — O30049 Twin pregnancy, dichorionic/diamniotic, unspecified trimester: Secondary | ICD-10-CM | POA: Insufficient documentation

## 2010-09-19 DIAGNOSIS — R109 Unspecified abdominal pain: Secondary | ICD-10-CM | POA: Insufficient documentation

## 2010-09-19 DIAGNOSIS — O30009 Twin pregnancy, unspecified number of placenta and unspecified number of amniotic sacs, unspecified trimester: Secondary | ICD-10-CM | POA: Insufficient documentation

## 2010-09-19 DIAGNOSIS — O99891 Other specified diseases and conditions complicating pregnancy: Secondary | ICD-10-CM | POA: Insufficient documentation

## 2010-09-19 LAB — URINALYSIS, ROUTINE W REFLEX MICROSCOPIC
Bilirubin Urine: NEGATIVE
Ketones, ur: NEGATIVE mg/dL
Nitrite: NEGATIVE
Urobilinogen, UA: 0.2 mg/dL (ref 0.0–1.0)

## 2010-09-19 NOTE — ED Provider Notes (Signed)
History   30yo wf G2P1 with Di DI twin gestation and cramping. See Dictated note  Chief Complaint  Patient presents with  . Abdominal Cramping   HPIsee dictated note  OB History    Grav Para Term Preterm Abortions TAB SAB Ect Mult Living   2 1 1  0 0 0 0 0 0 1      Past Medical History  Diagnosis Date  . Infertility associated with anovulation   . Headache     Past Surgical History  Procedure Date  . Tonsillectomy   . Partial amputation of rt great toe     No family history on file.  History  Substance Use Topics  . Smoking status: Never Smoker   . Smokeless tobacco: Never Used  . Alcohol Use: No    Allergies:  Allergies  Allergen Reactions  . Tramadol Nausea And Vomiting    Prescriptions prior to admission  Medication Sig Dispense Refill  . acetaminophen (TYLENOL) 500 MG tablet Take 1,000 mg by mouth every 6 (six) hours as needed. For headache       . butalbital-aspirin-caffeine (BUTALBITAL COMPOUND/ASA) 50-325-40 MG per capsule Take 1-2 capsules by mouth every 4 (four) hours as needed. For pain      . cyclobenzaprine (FLEXERIL) 5 MG tablet Take 5 mg by mouth 2 (two) times daily as needed. For muscle spasms       . docusate sodium (COLACE) 100 MG capsule Take 100 mg by mouth 2 (two) times daily as needed. For constipation      . Meclizine HCl (BONINE PO) Take 1 tablet by mouth as needed. For nausea      . omeprazole (PRILOSEC OTC) 20 MG tablet Take 20 mg by mouth daily. Antiacid, reduce nausea       . ondansetron (ZOFRAN) 4 MG tablet Take 4 mg by mouth every 8 (eight) hours as needed. For nausea       . polyethylene glycol (MIRALAX / GLYCOLAX) packet Take 17 g by mouth daily as needed. For constipation      . prenatal vitamin w/FE, FA (PRENATAL 1 + 1) 27-1 MG TABS Take 1 tablet by mouth daily.        . promethazine (PHENERGAN) 25 MG tablet Take 25 mg by mouth every 6 (six) hours as needed. For nausea       . pyridOXINE (B-6) 50 MG tablet Take 50 mg by mouth  daily.        Marland Kitchen zolpidem (AMBIEN) 5 MG tablet Take 1 tablet (5 mg total) by mouth at bedtime as needed for sleep.  30 tablet  0    ROSsee note Physical Exam   Blood pressure 132/89, pulse 93, temperature 98.4 F (36.9 C), temperature source Oral, resp. rate 16, height 5\' 2"  (1.575 m), weight 80.468 kg (177 lb 6.4 oz), last menstrual period 04/05/2010.  Physical Examsee dictated note  MAU Course  Procedures Toco pending  MDM na  Assessment and Plan  20 week Mercie Eon twin gestation with cramping  See dictated note  Dayne Chait J 09/19/2010, 2:23 PM

## 2010-09-19 NOTE — Progress Notes (Signed)
Cramping in upper abdomen then radiating down to lower abdomen since this morning, no vaginal bleeding, twin gestation.

## 2010-09-19 NOTE — Progress Notes (Signed)
DR. Billy Coast UI NOTED X2 UC'S 20-30 SEC. ORDERS GIVEN TO D/C HOME.

## 2010-09-20 NOTE — H&P (Signed)
NAMEKORISSA, HORSFORD NO.:  [id number]  MEDICAL RECORD NO.:  [id number]  LOCATION:  [facility]                          FACILITY:  [facility]  PHYSICIAN:  Lenoard Aden, M.D.DATE OF BIRTH:  [birth year]  DATE OF ADMISSION:  09/19/2010 DATE OF DISCHARGE:  09/19/2010                             HISTORY & PHYSICAL   CHIEF COMPLAINT:  Lower abdominal cramping.  HISTORY OF PRESENT ILLNESS:  The patient is a 30 year old white female G2, P1, history of SAB in the past who presents now at approximately 20 weeks Di-Di twin gestation with lower abdominal cramping.  She denies bleeding, contractions, or leakage of fluid.  She denies change in vaginal discharge.  She reports no abdominal trauma.  She reports good fetal movement.  ALLERGIES:  She has allergies to TRAMADOL.  MEDICATIONS:  Ambien p.r.n., Fioricet p.r.n., Reglan p.r.n., Zofran p.r.n., Phenergan p.r.n., prenatal vitamins, MiraLax p.r.n., and Colace p.r.n.  SOCIAL HISTORY:  Noncontributory.  FAMILY HISTORY:  She has a family history of breast cancer.  OBSTETRIC HISTORY:  Remarkable for one uncomplicated vaginal delivery of a female __________ .  SURGICAL HISTORY:  Amputation of a toe in 2010 and tonsillectomy in 2007.  Prenatal course to date has been complicated.  PHYSICAL EXAMINATION:  GENERAL:  A well-developed, well-nourished white female in no acute distress. HEENT:  Normal. NECK:  Supple.  Full range of infection. LUNGS:  Clear. HEART:  Regular rate and rhythm. ABDOMEN:  Soft, gravid, and nontender. EXTREMITIES:  There are no cords. NEUROLOGIC:  Nonfocal and intact. SKIN:  Intact. PELVIC:  Cervix is long, closed and presenting part of the pelvis.  Fetal heart tones to be auscultated by Doppler, fetal toco pending.  IMPRESSION:  20-week twin intrauterine pregnancy, previous history of a normal review of anatomy for Di-Di twin gestation on September 03, 2010. At that time, she was noted she had a normal  cervical length at that time.  20- week Di-Di twin pregnancy with lower abdominal cramping.  No bleeding or leakage of fluid with normal cervical length.  PLAN OF ACTION:  __________  Joseph Art.  After toco, will be discharged home, with precautions given.     Lenoard Aden, M.D.     RJT/MEDQ  D:  09/19/2010  T:  09/19/2010  Job:  [id number]

## 2010-10-04 LAB — URINALYSIS, ROUTINE W REFLEX MICROSCOPIC
Bilirubin Urine: NEGATIVE
Glucose, UA: NEGATIVE
Hgb urine dipstick: NEGATIVE
Ketones, ur: NEGATIVE
pH: 7

## 2010-10-04 LAB — COMPREHENSIVE METABOLIC PANEL
ALT: 27
Alkaline Phosphatase: 61
Chloride: 104
Glucose, Bld: 93
Potassium: 4.3
Sodium: 139
Total Protein: 7.2

## 2010-10-04 LAB — CBC
Hemoglobin: 14.1
RBC: 4.51
RDW: 12.3
WBC: 6.4

## 2010-10-04 LAB — POCT URINALYSIS DIP (DEVICE)
Hgb urine dipstick: NEGATIVE
Operator id: 235561
Protein, ur: NEGATIVE
Specific Gravity, Urine: 1.02
Urobilinogen, UA: 0.2

## 2010-10-04 LAB — DIFFERENTIAL
Basophils Relative: 0
Eosinophils Absolute: 0
Monocytes Absolute: 0.3
Monocytes Relative: 5
Neutrophils Relative %: 69

## 2010-10-04 LAB — POCT PREGNANCY, URINE: Preg Test, Ur: NEGATIVE

## 2010-10-04 LAB — URINE MICROSCOPIC-ADD ON

## 2010-10-28 ENCOUNTER — Inpatient Hospital Stay (HOSPITAL_COMMUNITY)
Admission: AD | Admit: 2010-10-28 | Discharge: 2010-10-29 | Disposition: A | Discharge status: Home / Self Care | Payer: 59 | Source: Ambulatory Visit | Attending: Obstetrics & Gynecology | Admitting: Obstetrics & Gynecology

## 2010-10-28 DIAGNOSIS — O4700 False labor before 37 completed weeks of gestation, unspecified trimester: Secondary | ICD-10-CM | POA: Insufficient documentation

## 2010-10-28 DIAGNOSIS — O30049 Twin pregnancy, dichorionic/diamniotic, unspecified trimester: Secondary | ICD-10-CM | POA: Insufficient documentation

## 2010-10-28 DIAGNOSIS — O30009 Twin pregnancy, unspecified number of placenta and unspecified number of amniotic sacs, unspecified trimester: Secondary | ICD-10-CM | POA: Insufficient documentation

## 2010-10-28 LAB — URINALYSIS, ROUTINE W REFLEX MICROSCOPIC
Bilirubin Urine: NEGATIVE
Glucose, UA: NEGATIVE mg/dL
Hgb urine dipstick: NEGATIVE
Ketones, ur: NEGATIVE mg/dL
Protein, ur: NEGATIVE mg/dL
Urobilinogen, UA: 0.2 mg/dL (ref 0.0–1.0)

## 2010-10-28 LAB — URINE MICROSCOPIC-ADD ON

## 2010-10-28 NOTE — ED Provider Notes (Signed)
History    Chief Complaint  Patient presents with  . Contractions   HPI G2P1001, Di-Di twins, 25.6/7 wks, c/o UCs since this evening, but resolved when she got here. No vag blding/ no leaking/ no HAs. No UTI symptoms. Good fetal movements x 2.   Past Medical History  Diagnosis Date  . Infertility associated with anovulation   . Headache     Past Surgical History  Procedure Date  . Tonsillectomy   . Partial amputation of rt great toe     No family history on file.  History  Substance Use Topics  . Smoking status: Never Smoker   . Smokeless tobacco: Never Used  . Alcohol Use: No    Allergies:  Allergies  Allergen Reactions  . Tramadol Nausea And Vomiting    Prescriptions prior to admission  Medication Sig Dispense Refill  . prenatal vitamin w/FE, FA (PRENATAL 1 + 1) 27-1 MG TABS Take 1 tablet by mouth daily.        Marland Kitchen pyridOXINE (B-6) 50 MG tablet Take 50 mg by mouth daily.        Marland Kitchen zolpidem (AMBIEN) 5 MG tablet Take 1 tablet (5 mg total) by mouth at bedtime as needed for sleep.  30 tablet  0  . acetaminophen (TYLENOL) 500 MG tablet Take 1,000 mg by mouth every 6 (six) hours as needed. For headache      . butalbital-aspirin-caffeine (BUTALBITAL COMPOUND/ASA) 50-325-40 MG per capsule Take 1-2 capsules by mouth every 4 (four) hours as needed. For pain      . cyclobenzaprine (FLEXERIL) 5 MG tablet Take 5 mg by mouth 2 (two) times daily as needed. For muscle spasms       . docusate sodium (COLACE) 100 MG capsule Take 100 mg by mouth 2 (two) times daily as needed. For constipation      . Meclizine HCl (BONINE PO) Take 1 tablet by mouth as needed. For nausea      . omeprazole (PRILOSEC OTC) 20 MG tablet Take 20 mg by mouth daily. Antiacid, reduce nausea       . ondansetron (ZOFRAN) 4 MG tablet Take 4 mg by mouth every 8 (eight) hours as needed. For nausea       . polyethylene glycol (MIRALAX / GLYCOLAX) packet Take 17 g by mouth daily as needed. For constipation      .  promethazine (PHENERGAN) 25 MG tablet Take 25 mg by mouth every 6 (six) hours as needed. For nausea         ROS Physical Exam  Physical Exam A&O x 3, no acute distress. Pleasant HEENT neg, no thyromegaly Lungs CTA bilat CV RRR, S1S2 normal Abdo soft, non tender, non acute Extr no edema/ tenderness Pelvic Cervix closed, long. No vag bleeding. FFN negative.  FHT  (+) A and B on bedside sono, both babies with active fetal movements noted, both transverse lie/ Toco NONE.  MAU Course  Procedures UA - negative FFN - negative.   Assessment and Plan  Twins at 25.6/7 wks, threatened PTL. No evidence of PTL. D/c home. Office visit 1 wk. PTL precautions.   Tavarion Babington R 10/28/2010, 11:57 PM

## 2010-10-28 NOTE — Progress Notes (Signed)
DR MODY IN ROOM

## 2010-12-03 ENCOUNTER — Inpatient Hospital Stay (HOSPITAL_COMMUNITY)
Admission: AD | Admit: 2010-12-03 | Discharge: 2010-12-04 | Disposition: A | Discharge status: Home / Self Care | Payer: 59 | Source: Ambulatory Visit | Attending: Obstetrics and Gynecology | Admitting: Obstetrics and Gynecology

## 2010-12-03 DIAGNOSIS — O99891 Other specified diseases and conditions complicating pregnancy: Secondary | ICD-10-CM | POA: Insufficient documentation

## 2010-12-03 DIAGNOSIS — M549 Dorsalgia, unspecified: Secondary | ICD-10-CM | POA: Insufficient documentation

## 2010-12-04 ENCOUNTER — Encounter (HOSPITAL_COMMUNITY): Payer: Self-pay

## 2010-12-04 NOTE — Progress Notes (Signed)
Pt states, " I've had low back and hip pain for five days.  Today low abdominal pain started.I've tried flexoril 10 mg , and tylenol pm ( 2 caps)  without relief. Dr Cherly Hensen told me to come in ."

## 2010-12-04 NOTE — Progress Notes (Signed)
History   G2P1001 MWF now @ 31 plus week  Twin gestation presents for evaluation of ongoing back pain and lack of sleep for several days. Pt took Tylenol pm, flexeril @ 9 pm. Pt is on bedrest pt is seeing chiropractor for back pain. Pt has used ambien in the past  Chief Complaint  Patient presents with  . Back Pain  . Hip Pain     OB History    Grav Para Term Preterm Abortions TAB SAB Ect Mult Living   2 1 1  0 0 0 0 0 0 1      Past Medical History  Diagnosis Date  . Infertility associated with anovulation   . Headache     Past Surgical History  Procedure Date  . Tonsillectomy   . Partial amputation of rt great toe     History reviewed. No pertinent family history.  History  Substance Use Topics  . Smoking status: Never Smoker   . Smokeless tobacco: Never Used  . Alcohol Use: No    Allergies:  Allergies  Allergen Reactions  . Tramadol Nausea And Vomiting    Prescriptions prior to admission  Medication Sig Dispense Refill  . cyclobenzaprine (FLEXERIL) 5 MG tablet Take 10 mg by mouth 2 (two) times daily as needed. For spasms       . prenatal vitamin w/FE, FA (PRENATAL 1 + 1) 27-1 MG TABS Take 1 tablet by mouth daily.        . ranitidine (ZANTAC) 150 MG tablet Take 150 mg by mouth 2 (two) times daily as needed. For heartburn       . DISCONTD: diphenhydramine-acetaminophen (TYLENOL PM) 25-500 MG TABS Take 1 tablet by mouth at bedtime as needed. For pain          Physical Exam   Blood pressure 119/77, pulse 68, temperature 98.8 F (37.1 C), temperature source Oral, resp. rate 18, height 5\' 2"  (1.575 m), weight 87.317 kg (192 lb 8 oz), last menstrual period 04/05/2010. WDWN white female in NAD Abd gravid nontender Pelvic: f/flaredt/50/-3 ED Course  Back pain Twin gestation P) may use ambien for sleep. Cont heat to back. To keep OB appt on Tuesday and ask about lidocaine patch MDM   Noreene Boreman A, MD 1:25 AM 12/04/2010

## 2010-12-04 NOTE — Progress Notes (Signed)
Patient is here with c/o constant lower back pain, hips and irregular contractions. She states that she had no relief from 10mg  flexeril and tylenol pm. She denies any vaginal bleeding or lof. She reports good fetal movement.s

## 2010-12-14 ENCOUNTER — Other Ambulatory Visit (HOSPITAL_COMMUNITY): Payer: Self-pay | Admitting: Family Medicine

## 2010-12-14 DIAGNOSIS — D352 Benign neoplasm of pituitary gland: Secondary | ICD-10-CM

## 2010-12-16 ENCOUNTER — Inpatient Hospital Stay (HOSPITAL_COMMUNITY)
Admission: AD | Admit: 2010-12-16 | Discharge: 2010-12-16 | Disposition: A | Discharge status: Home / Self Care | Payer: 59 | Source: Ambulatory Visit | Attending: Obstetrics and Gynecology | Admitting: Obstetrics and Gynecology

## 2010-12-16 ENCOUNTER — Encounter (HOSPITAL_COMMUNITY): Payer: Self-pay | Admitting: *Deleted

## 2010-12-16 DIAGNOSIS — R51 Headache: Secondary | ICD-10-CM | POA: Insufficient documentation

## 2010-12-16 DIAGNOSIS — O26879 Cervical shortening, unspecified trimester: Secondary | ICD-10-CM | POA: Insufficient documentation

## 2010-12-16 DIAGNOSIS — O30009 Twin pregnancy, unspecified number of placenta and unspecified number of amniotic sacs, unspecified trimester: Secondary | ICD-10-CM | POA: Insufficient documentation

## 2010-12-16 DIAGNOSIS — O26899 Other specified pregnancy related conditions, unspecified trimester: Secondary | ICD-10-CM

## 2010-12-16 LAB — URINALYSIS, ROUTINE W REFLEX MICROSCOPIC
Nitrite: NEGATIVE
Specific Gravity, Urine: 1.02 (ref 1.005–1.030)
Urobilinogen, UA: 0.2 mg/dL (ref 0.0–1.0)
pH: 6 (ref 5.0–8.0)

## 2010-12-16 LAB — URINE MICROSCOPIC-ADD ON

## 2010-12-16 MED ORDER — ACETAMINOPHEN 500 MG PO TABS
1000.0000 mg | ORAL_TABLET | Freq: Once | ORAL | Status: AC
  Administered 2010-12-16: 1000 mg via ORAL
  Filled 2010-12-16: qty 2

## 2010-12-16 MED ORDER — ACETAMINOPHEN 500 MG PO TABS: 1000.0000 mg | ORAL_TABLET | Freq: Once | ORAL | Status: AC

## 2010-12-16 NOTE — ED Provider Notes (Signed)
History   30 yo G2P1 MWF  Twin gestation now @ 32 plus week gestation presents for eval of h/a  Since last night . Pt started procardia yesterday for shortening cervix. Denies blurry vision or epigastric pain. In office BP 120/94 yesterday w/o change in weight or proteinuria. Pt did not take any med for h/a. Also c/o right back pain radiating under rib  Chief Complaint  Patient presents with  . Hypertension     OB History    Grav Para Term Preterm Abortions TAB SAB Ect Mult Living   2 1 1  0 0 0 0 0 0 1      Past Medical History  Diagnosis Date  . Infertility associated with anovulation   . Headache     Past Surgical History  Procedure Date  . Tonsillectomy   . Partial amputation of rt great toe     History reviewed. No pertinent family history.  History  Substance Use Topics  . Smoking status: Never Smoker   . Smokeless tobacco: Never Used  . Alcohol Use: No    Allergies:  Allergies  Allergen Reactions  . Tramadol Nausea And Vomiting    Prescriptions prior to admission  Medication Sig Dispense Refill  . cyclobenzaprine (FLEXERIL) 5 MG tablet Take 10 mg by mouth 2 (two) times daily as needed. For spasms      . diphenhydramine-acetaminophen (TYLENOL PM) 25-500 MG TABS Take 1 tablet by mouth at bedtime as needed. sleep       . NIFEdipine (PROCARDIA) 10 MG capsule Take 10 mg by mouth 3 (three) times daily.        . prenatal vitamin w/FE, FA (PRENATAL 1 + 1) 27-1 MG TABS Take 1 tablet by mouth daily.        . ranitidine (ZANTAC) 150 MG tablet Take 150 mg by mouth 2 (two) times daily as needed. For heartburn      . zolpidem (AMBIEN) 5 MG tablet Take 5 mg by mouth at bedtime as needed. sleep          Physical Exam   Blood pressure 127/85, pulse 86, temperature 97.8 F (36.6 C), temperature source Oral, resp. rate 18, height 5\' 2"  (1.575 m), weight 89.631 kg (197 lb 9.6 oz), last menstrual period 04/05/2010, SpO2 99.00%. Urine neg protein General appearance: alert,  cooperative and no distress Lungs: clear to auscultation bilaterally Abdomen: GRAVID NONTENDER Pelvic: deferred Extremities: edema trace ED Course  Twins @ 32 plus week H/a related to procardia P) no evidence of preeclampsia. Tylenol 1000mg  po x 1.  stop procardia. Disc with / Dr. Juliene Pina. Keep Ob appt 12/14. MDM  Serita Kyle, MD 7:31 AM 12/16/2010

## 2010-12-16 NOTE — Progress Notes (Signed)
Pt G2 P1 at 32.6wks with twins, seen in the office 12/12, elevated BP, contracting-started on procardia.  Pt having pain that radiates from shoulder blade around to chest, also reports headache and swelling in hands and feet.

## 2010-12-16 NOTE — Progress Notes (Signed)
Notified of pt presenting for HA, elevated BP at home and contractions.  Notified of BP values here and UA results.  Will be up to see pt.

## 2010-12-21 ENCOUNTER — Inpatient Hospital Stay (HOSPITAL_COMMUNITY): Admission: RE | Admit: 2010-12-21 | Payer: 59 | Source: Ambulatory Visit

## 2010-12-23 ENCOUNTER — Encounter (HOSPITAL_COMMUNITY): Payer: Self-pay

## 2010-12-23 ENCOUNTER — Observation Stay (HOSPITAL_COMMUNITY)
Admission: AD | Admit: 2010-12-23 | Discharge: 2010-12-24 | Disposition: A | Discharge status: Home / Self Care | Payer: 59 | Source: Ambulatory Visit | Attending: Obstetrics & Gynecology | Admitting: Obstetrics & Gynecology

## 2010-12-23 DIAGNOSIS — O30049 Twin pregnancy, dichorionic/diamniotic, unspecified trimester: Secondary | ICD-10-CM | POA: Diagnosis present

## 2010-12-23 DIAGNOSIS — O30009 Twin pregnancy, unspecified number of placenta and unspecified number of amniotic sacs, unspecified trimester: Secondary | ICD-10-CM | POA: Insufficient documentation

## 2010-12-23 DIAGNOSIS — O99019 Anemia complicating pregnancy, unspecified trimester: Secondary | ICD-10-CM | POA: Insufficient documentation

## 2010-12-23 DIAGNOSIS — O139 Gestational [pregnancy-induced] hypertension without significant proteinuria, unspecified trimester: Principal | ICD-10-CM | POA: Diagnosis present

## 2010-12-23 DIAGNOSIS — D649 Anemia, unspecified: Secondary | ICD-10-CM | POA: Insufficient documentation

## 2010-12-23 LAB — COMPREHENSIVE METABOLIC PANEL
AST: 12 U/L (ref 0–37)
Albumin: 2.2 g/dL — ABNORMAL LOW (ref 3.5–5.2)
BUN: 9 mg/dL (ref 6–23)
Calcium: 8.2 mg/dL — ABNORMAL LOW (ref 8.4–10.5)
Chloride: 104 mEq/L (ref 96–112)
Creatinine, Ser: 0.73 mg/dL (ref 0.50–1.10)
GFR calc non Af Amer: >90 mL/min (ref >90)
Total Bilirubin: 0.2 mg/dL — ABNORMAL LOW (ref 0.3–1.2)

## 2010-12-23 LAB — URIC ACID: Uric Acid, Serum: 5.7 mg/dL (ref 2.4–7.0)

## 2010-12-23 LAB — CBC
MCV: 84.8 fL (ref 78.0–100.0)
Platelets: 140 10*3/uL — ABNORMAL LOW (ref 150–400)
RBC: 3.22 MIL/uL — ABNORMAL LOW (ref 3.87–5.11)
RDW: 13.4 % (ref 11.5–15.5)
WBC: 8.1 10*3/uL (ref 4.0–10.5)

## 2010-12-23 MED ORDER — DOCUSATE SODIUM 100 MG PO CAPS
100.0000 mg | ORAL_CAPSULE | Freq: Every day | ORAL | Status: DC
  Administered 2010-12-24: 100 mg via ORAL
  Filled 2010-12-23: qty 1

## 2010-12-23 MED ORDER — AMOXICILLIN 500 MG PO CAPS
500.0000 mg | ORAL_CAPSULE | Freq: Two times a day (BID) | ORAL | Status: DC
  Administered 2010-12-23 – 2010-12-24 (×2): 500 mg via ORAL
  Filled 2010-12-23 (×4): qty 1

## 2010-12-23 MED ORDER — ACETAMINOPHEN 325 MG PO TABS
650.0000 mg | ORAL_TABLET | ORAL | Status: DC | PRN
  Administered 2010-12-23 – 2010-12-24 (×3): 650 mg via ORAL
  Filled 2010-12-23 (×3): qty 2

## 2010-12-23 MED ORDER — ZOLPIDEM TARTRATE 10 MG PO TABS
10.0000 mg | ORAL_TABLET | Freq: Every evening | ORAL | Status: DC | PRN
  Administered 2010-12-23: 10 mg via ORAL
  Filled 2010-12-23: qty 1

## 2010-12-23 MED ORDER — CALCIUM CARBONATE ANTACID 500 MG PO CHEW: 2.0000 | CHEWABLE_TABLET | ORAL | Status: DC | PRN

## 2010-12-23 MED ORDER — PRENATAL MULTIVITAMIN CH
1.0000 | ORAL_TABLET | Freq: Every day | ORAL | Status: DC
Start: 2010-12-23 — End: 2010-12-24
  Administered 2010-12-24: 1 via ORAL
  Filled 2010-12-23: qty 1

## 2010-12-23 NOTE — H&P (Signed)
[name] is a 30 y.o. female presenting for BP evaluation from office after it was noted to be 160/105 in office. C/o LE swelling. No HA/vision problems, but has ear infection and pain. [facility] and given Amox and ear drops for that. Patient took Sudafed this morning.  Denies UCs/pelvic pain. No vag blding/ leaking. Has good FMs.   P[facility]are at [facility] from 8 wks. Di-Di twins following Clomid x1 cycle.  Rough Prenatal course with multiple MAU visits for hyperemesis, then multiple office visits for HAs and anxiety, unable to cope at work, and has been on disability for several months. Seen frequently in office for pelvic pain , back pain etc.  Di-Di twins are AGA, normal growth on sono.   History OB History    Grav Para Term Preterm Abortions TAB SAB Ect Mult Living   2 1 1  0 0 0 0 0 0 1     Past Medical History  Diagnosis Date  . Infertility associated with anovulation   . Headache    Past Surgical History  Procedure Date  . Tonsillectomy   . Partial amputation of rt great toe    Family History: family history is not on file. Social History:  reports that she has never smoked. She has never used smokeless tobacco. She reports that she does not drink alcohol or use illicit drugs.  Review of Systems  Constitutional: Negative for fever.  HENT: Positive for ear pain.   Eyes: Negative for blurred vision.  Respiratory: Negative for cough.   Cardiovascular: Positive for leg swelling. Negative for chest pain.  Neurological: Negative for dizziness and headaches.     Blood pressure 138/97, pulse 80, temperature 98.6 F (37 C), temperature source Oral, resp. rate 20, height 5\' 2"  (1.575 m), weight 203 lb 3.2 oz (92.171 kg), last menstrual period 04/05/2010, SpO2 97.00%. Exam Serial BPs reviewed, most 140/90+, will admit for 24 hr obs.  Physical Exam  A&O x 3, no acute distress. Pleasant HEENT neg Lungs CTA bilat CV RRR, S1S2 normal Abdo soft, non tender, non  acute Extr no edema/ tenderness Pelvic deferred  FHT  A 130s/ + accels/ no decels/ moerated variabilit; Reactive          B 140s/ + accels/ no decels/ moderate variability; Reactive.  Toco occ irritability  Prenatal labs: ABO, Rh:  B(+) Antibody:  negative  Rubella:  Immune RPR:   Neg HBsAg:   Neg HIV:   Neg GBS:   not done 1 hr abn, 3 hr GTT normal Ultrascreen and AFP1 negative Ob Anatomy sono wnl, normal interval growth sonos.   Assessment/Plan: 30 yo, G2P1001, with Di-Di twins at 33.6/7 wks, admitted with Gestational HTN, r/o Preeclampsia (at risk since twins), LE swelling. Plan 24 hr obs admission.  1) Serial BPs, PIH labs, 24 hr urine protein/creatinine 2) Twins- CEFM and will go NST q shift after 12 hrs if BPs stable 3) Prematurity- 34 wks tomorrow, will hold BTMZ unless BPs unstable. NICU consult if maternal condition worsen in case preterm delivery needed.    Plan reviewed, patient understands and agrees.   Darrion Macaulay R 12/23/2010, 4:00 PM

## 2010-12-23 NOTE — Progress Notes (Signed)
BP elevated at office earlier, increased swelling. 150/100 in office today.  Have ear infection, dx today.  Denies HA or epigastric pain, no visual changes.

## 2010-12-24 LAB — CREATININE CLEARANCE, URINE, 24 HOUR
Collection Interval-CRCL: 24 hours
Creatinine Clearance: 134 mL/min — ABNORMAL HIGH (ref 75–115)
Creatinine, Urine: 112.56 mg/dL

## 2010-12-24 MED ORDER — ONDANSETRON 4 MG PO TBDP
4.0000 mg | ORAL_TABLET | Freq: Once | ORAL | Status: AC
  Administered 2010-12-24: 4 mg via ORAL
  Filled 2010-12-24: qty 1

## 2010-12-24 MED ORDER — LABETALOL HCL 100 MG PO TABS: 200.0000 mg | ORAL_TABLET | Freq: Two times a day (BID) | ORAL | Status: DC

## 2010-12-24 MED ORDER — LABETALOL HCL 100 MG PO TABS
100.0000 mg | ORAL_TABLET | Freq: Two times a day (BID) | ORAL | Status: DC
  Administered 2010-12-24: 100 mg via ORAL
  Filled 2010-12-24 (×3): qty 1

## 2010-12-24 NOTE — Discharge Summary (Signed)
Pt verbalizes and understand discharge instructions.

## 2010-12-24 NOTE — Progress Notes (Signed)
UR Chart review completed.  

## 2010-12-24 NOTE — Discharge Summary (Signed)
Physician Discharge Summary  Patient ID: [name]  MRN: [id number]  DOB/AGE: Jan 25, 1980 30 y.o.  Admit date: 12/23/2010 Discharge date: 12/24/2010 Admission Diagnoses: 33.6/7 wks, Di-Di twins, elevated BPs and edema, rule out Preclampsia Discharge Diagnoses: 34 wks, Di-Di twins, Gestational HTN. Principal Problem:  *Gestational hypertension Active Problems:  Twin pregnancy, twins dichorionic and diamniotic  Discharged Condition: Improved, edema resolved. 24 hr urine protein  Hospital Course: 30 yo, G2P1 at 33.6/7 wks, admitted after office BP of 160/105 and LE swelling. Patient had taken a Sudafed for ear congestion and was [facility] where high BP was noted. She has off and on headaches. Since admission LE swelling resolved with strict rest and BP improved in 130-140/80-90 range, she was started on Labetalol 100mg  q12h on 12/24/10 am.  PIH labs were normal with no HELLP syndrome. 24 hr urine pending. Will call patient back with result since plan is not to proceed with delivery until 37 wks and sooner if severe preeclampsia.  FHT (A,B) reactive. No UCs noted.   Discharge Exam: Blood pressure 130/91, pulse 80, temperature 98.2 F (36.8 C), temperature source Oral, resp. rate 20, height 5\' 2"  (1.575 m), weight 203 lb 3.2 oz (92.171 kg), last menstrual period 04/05/2010, SpO2 97.00%.  Disposition: Home or Self Care  Medication List  As of 12/24/2010  6:19 PM   ASK your doctor about these medications         acetaminophen 500 MG tablet   Commonly known as: TYLENOL   Take 2 tablets (1,000 mg total) by mouth once.      amoxicillin 875 MG tablet   Commonly known as: AMOXIL      cyclobenzaprine 5 MG tablet   Commonly known as: FLEXERIL      neomycin-colistin-hydrocortisone-thonzonium 3.03-05-08-0.5 MG/ML otic suspension   Commonly known as: CORTISPORIN TC      prenatal vitamin w/FE, FA 27-1 MG Tabs      ranitidine 150 MG tablet   Commonly known as: ZANTAC     zolpidem 5 MG tablet   Commonly known as: AMBIEN          Labetalol 200 mg orally every 12 hrs Famotidine 20 mg two times daily and Zofran 4 mg orally 2 times as needed for nausea. Stop Zantac if not helping Take Iron (ferrous sulfate) three times daily with stool sofner or fiber supplement   Follow-up Information    Follow up with Verita Kuroda R in 1 week.   Contact information:   [address] [place] [address] [phone]        Preterm labor precautions. Fetal activity count. Preeclampsia warning signs reviewed.   Signed: Christepher Melchior R 12/24/2010, 6:19 PM

## 2010-12-24 NOTE — Progress Notes (Signed)
Patient ID: [name], female   DOB: 04/04/1980, 30 y.o.   MRN: [id number] Subjective: Feels well, LE swelling resolved entirely with bedrest. No HA, slept well. Good FMs, no UCs/ vaginal leaking or bleeding.   Objective: Vital signs in last 24 hours: Temp:  [98.1 F (36.7 C)-98.6 F (37 C)] 98.1 F (36.7 C) (12/20 1948) Pulse Rate:  [74-86] 79  (12/20 1948) Resp:  [16-20] 16  (12/21 0500) BP: (132-141)/(82-97) 132/82 mmHg (12/20 1948) SpO2:  [97 %] 97 % (12/20 1439) Weight:  [203 lb 3.2 oz (92.171 kg)] 203 lb 3.2 oz (92.171 kg) (12/20 1439) Weight change:    Physical exam:  A&O x 3, no acute distress. Pleasant Lungs CTA bilat CV RRR, S1S2 normal Abdo soft, non tender, non acute, relaxed gravid uterus Extr no edema/ tenderness FHT  140s/ reactive            130s/ reactive Toco None  Lab Results:  Basename 12/23/10 1544  WBC 8.1  HGB 8.9*  HCT 27.3*  PLT 140*   BMET  Basename 12/23/10 1544  NA 135  K 3.6  CL 104  CO2 20  GLUCOSE 86  BUN 9  CREATININE 0.73  CALCIUM 8.2*   24 hr urine protein and creatinine pending.   Assessment/Plan: 30 yo, G2P1 at 34 wks with Di-Di twins. Gestational HTN. 24 hr urine protein pending, start Labetalol 100 mg q12hr. PIH labs normal.  Anemia - needs Iron tid Twins - Reactive on CEFM, will change to NST q shift.  Anticipate Dc home after 24 hr urine back.    LOS: 1 day   Yuri Fana R 12/24/2010, 6:49 AM

## 2010-12-25 ENCOUNTER — Encounter (HOSPITAL_COMMUNITY): Payer: Self-pay

## 2010-12-25 ENCOUNTER — Inpatient Hospital Stay (HOSPITAL_COMMUNITY)
Admission: AD | Admit: 2010-12-25 | Discharge: 2011-01-01 | DRG: 765 | Disposition: A | Discharge status: Home / Self Care | Payer: 59 | Source: Ambulatory Visit | Attending: Obstetrics and Gynecology | Admitting: Obstetrics and Gynecology

## 2010-12-25 DIAGNOSIS — Z302 Encounter for sterilization: Secondary | ICD-10-CM

## 2010-12-25 DIAGNOSIS — IMO0001 Reserved for inherently not codable concepts without codable children: Secondary | ICD-10-CM

## 2010-12-25 DIAGNOSIS — O30009 Twin pregnancy, unspecified number of placenta and unspecified number of amniotic sacs, unspecified trimester: Secondary | ICD-10-CM | POA: Diagnosis present

## 2010-12-25 DIAGNOSIS — O30049 Twin pregnancy, dichorionic/diamniotic, unspecified trimester: Secondary | ICD-10-CM | POA: Diagnosis present

## 2010-12-25 DIAGNOSIS — O99893 Other specified diseases and conditions complicating puerperium: Secondary | ICD-10-CM | POA: Diagnosis present

## 2010-12-25 DIAGNOSIS — O9903 Anemia complicating the puerperium: Secondary | ICD-10-CM | POA: Diagnosis not present

## 2010-12-25 DIAGNOSIS — O139 Gestational [pregnancy-induced] hypertension without significant proteinuria, unspecified trimester: Secondary | ICD-10-CM

## 2010-12-25 DIAGNOSIS — D649 Anemia, unspecified: Secondary | ICD-10-CM

## 2010-12-25 DIAGNOSIS — O99345 Other mental disorders complicating the puerperium: Secondary | ICD-10-CM | POA: Diagnosis present

## 2010-12-25 DIAGNOSIS — F329 Major depressive disorder, single episode, unspecified: Secondary | ICD-10-CM | POA: Diagnosis present

## 2010-12-25 DIAGNOSIS — F3289 Other specified depressive episodes: Secondary | ICD-10-CM | POA: Diagnosis present

## 2010-12-25 DIAGNOSIS — O1414 Severe pre-eclampsia complicating childbirth: Principal | ICD-10-CM | POA: Diagnosis present

## 2010-12-25 DIAGNOSIS — O1490 Unspecified pre-eclampsia, unspecified trimester: Secondary | ICD-10-CM | POA: Diagnosis present

## 2010-12-25 DIAGNOSIS — O1410 Severe pre-eclampsia, unspecified trimester: Secondary | ICD-10-CM | POA: Diagnosis present

## 2010-12-25 LAB — COMPREHENSIVE METABOLIC PANEL
ALT: 15 U/L (ref 0–35)
Albumin: 2 g/dL — ABNORMAL LOW (ref 3.5–5.2)
Alkaline Phosphatase: 168 U/L — ABNORMAL HIGH (ref 39–117)
Chloride: 106 mEq/L (ref 96–112)
Glucose, Bld: 100 mg/dL — ABNORMAL HIGH (ref 70–99)
Potassium: 3.7 mEq/L (ref 3.5–5.1)
Sodium: 137 mEq/L (ref 135–145)
Total Bilirubin: 0.2 mg/dL — ABNORMAL LOW (ref 0.3–1.2)
Total Protein: 5.5 g/dL — ABNORMAL LOW (ref 6.0–8.3)

## 2010-12-25 LAB — CBC
Hemoglobin: 8.6 g/dL — ABNORMAL LOW (ref 12.0–15.0)
MCHC: 32.6 g/dL (ref 30.0–36.0)
RDW: 13.7 % (ref 11.5–15.5)
WBC: 5.1 10*3/uL (ref 4.0–10.5)

## 2010-12-25 LAB — URINALYSIS, ROUTINE W REFLEX MICROSCOPIC
Nitrite: NEGATIVE
Specific Gravity, Urine: >1.03 — ABNORMAL HIGH (ref 1.005–1.030)
Urobilinogen, UA: 1 mg/dL (ref 0.0–1.0)
pH: 5.5 (ref 5.0–8.0)

## 2010-12-25 LAB — URINE MICROSCOPIC-ADD ON

## 2010-12-25 LAB — URIC ACID: Uric Acid, Serum: 6.8 mg/dL (ref 2.4–7.0)

## 2010-12-25 MED ORDER — BUTALBITAL-APAP-CAFFEINE 50-325-40 MG PO TABS
2.0000 | ORAL_TABLET | Freq: Once | ORAL | Status: AC
  Administered 2010-12-25: 2 via ORAL
  Filled 2010-12-25: qty 2

## 2010-12-25 NOTE — Progress Notes (Signed)
Patient states that she was asked to come in for further evaluation due to c/o swelling in her face, hands, legs and headache. She states that she has no relief from 1000mg  tylenol last dose was 1900pm. She reports decreased fetal movement.

## 2010-12-25 NOTE — H&P (Signed)
[name] is a 30 y.o. female [redacted]w[redacted]d twin gest presenting for headache unrelieved by Tylenol 1g and blurry vision today. Also reports increased facial and hands swelling. Also reports decreased urinary output today despite drinking plenty fluids. D/C'ed from antepartum unit yesterday after 24 hr obs for gest HTN and 24 hr TUP , diagnosis of mild PEC. Was started on Labetalol  200 mg PO BID. Denies epigastric pain, N/V/UCs/pelvic pain. No vag blding/ leaking. Has good FMs. Treated for ear infection 2 days ago, on Amox.    PNCare at [facility] from 8 wks. Di-Di twins following Clomid x1 cycle.  Rough Prenatal course with multiple MAU visits for hyperemesis, then multiple office visits for HAs and anxiety, unable to cope at work, and has been on disability for several months. Seen frequently in office for pelvic pain , back pain etc.  Di-Di twins are AGA, normal growth on sono.  History  OB History    Grav  Para  Term  Preterm  Abortions  TAB  SAB  Ect  Mult  Living    2  1  1   0  0  0  0  0  0  1      Past Medical History   Diagnosis  Date   .  Infertility associated with anovulation    .  Headache     Past Surgical History   Procedure  Date   .  Tonsillectomy    .  Partial amputation of rt great toe     Family History: family history is not on file.  Social History: reports that she has never smoked. She has never used smokeless tobacco. She reports that she does not drink alcohol or use illicit drugs.   Review of Systems: as noted   Filed Vitals:   12/25/10 2238 12/25/10 2259  BP: 141/87   Pulse: 72 85  Temp: 98.3 F (36.8 C)   TempSrc: Oral   Resp: 20   Height: 5\' 2"  (1.575 m)   Weight: 92.534 kg (204 lb)   Physical Exam  A&O x 3, no acute distress. Pleasant  HEENT neg  Lungs CTA bilat  CV RRR, S1S2 normal  Abdo soft, non tender, non acute  Extr (+) 1 pedal edema, neg Homan's Pelvic deferred  Neuro: neg clonus, DTR's +2  FHT A 125s/ + accels/ no decels/ moerated  variabilit; Reactive  B 120s/ + accels/ no decels/ moderate variability; Reactive.  Toco occ irritability   Lab Results  Component Value Date   WBC 5.1 12/25/2010   HGB 8.6* 12/25/2010   HCT 26.4* 12/25/2010   MCV 85.7 12/25/2010   PLT 123* 12/25/2010   Lab Results  Component Value Date   ALT 15 12/25/2010   AST 21 12/25/2010   ALKPHOS 168* 12/25/2010   BILITOT 0.2* 12/25/2010  Uric acid 6.8 LDH 185  Prenatal labs:  ABO, Rh: B(+)  Antibody: negative  Rubella: Immune  RPR: Neg  HBsAg: Neg  HIV: Neg  GBS: not done  1 hr abn, 3 hr GTT normal  Ultrascreen and AFP1 negative  Ob Anatomy sono wnl, normal interval growth sonos.   Assessment/Plan:  30 yo, G2P1001, with Di-Di twins at [redacted]w[redacted]d, admit with worsening PEC, BP controlled w/ Labetalol, r/o severe PEC   1. Serial BPs, repeat PIH labs in am, start 24 hr urine protein/creatinine, strict I&O 2.Twins- FWB cat 1 x 2,  continuous EFM 3. Prematurity - NICU consult if moving towards delivery 4. Dr.  [name] notified, to follow in AM   [facility] 12/26/2010 12:19 AM  Addendum-- Reviewed above note, patient evaluated, agree.  Mild PEC (based on BP and 24 hr urine protein 2000 mg), but now with symptoms of HA, blurred vision and subjective decreased urine output. Patient was advised to call back if symptoms got worse and she did. Was seen in MAU and admitted for possible severe PEC. FHT A, B are reactive. BPs more elevated than 24 hrs back despite Labetalol 200 mg q12hr. Labs reviewed, slight drop in Platelets, increased Uric acid, but AST, ALT, LDH normal. 24 hr urine pending collection.  Plan- repeat labs in 12 hrs, add T&S (anemia and twins), sono for growth. NICU and Anesthesia consult.  Will try to hold off delivery if maternal condition stable due to prematurity, but most likely will need delivery (c/s) in a few days. Patient counseled, understands.

## 2010-12-25 NOTE — Progress Notes (Signed)
Pt states, " I was discharged last night and they called today to tell me I have mild pre eclampsia. I have a headache that won't go away with tylenol and blurred vision, my eyes ,hands and legs are more swollen."

## 2010-12-26 ENCOUNTER — Inpatient Hospital Stay (HOSPITAL_COMMUNITY): Payer: 59

## 2010-12-26 ENCOUNTER — Encounter (HOSPITAL_COMMUNITY): Payer: Self-pay | Admitting: *Deleted

## 2010-12-26 DIAGNOSIS — O1490 Unspecified pre-eclampsia, unspecified trimester: Secondary | ICD-10-CM | POA: Diagnosis present

## 2010-12-26 LAB — TYPE AND SCREEN
ABO/RH(D): B POS
Antibody Screen: NEGATIVE

## 2010-12-26 LAB — COMPREHENSIVE METABOLIC PANEL
ALT: 21 U/L (ref 0–35)
Albumin: 1.9 g/dL — ABNORMAL LOW (ref 3.5–5.2)
Alkaline Phosphatase: 173 U/L — ABNORMAL HIGH (ref 39–117)
Chloride: 104 mEq/L (ref 96–112)
Glucose, Bld: 81 mg/dL (ref 70–99)
Potassium: 3.7 mEq/L (ref 3.5–5.1)
Sodium: 135 mEq/L (ref 135–145)
Total Bilirubin: 0.3 mg/dL (ref 0.3–1.2)
Total Protein: 5 g/dL — ABNORMAL LOW (ref 6.0–8.3)

## 2010-12-26 LAB — CBC
HCT: 26.6 % — ABNORMAL LOW (ref 36.0–46.0)
HCT: 27.2 % — ABNORMAL LOW (ref 36.0–46.0)
Hemoglobin: 8.7 g/dL — ABNORMAL LOW (ref 12.0–15.0)
MCH: 27.9 pg (ref 26.0–34.0)
MCHC: 32.7 g/dL (ref 30.0–36.0)
MCHC: 32 g/dL (ref 30.0–36.0)
MCV: 85.3 fL (ref 78.0–100.0)
MCV: 86.1 fL (ref 78.0–100.0)
Platelets: 130 10*3/uL — ABNORMAL LOW (ref 150–400)
Platelets: 129 10*3/uL — ABNORMAL LOW (ref 150–400)
RBC: 3.12 MIL/uL — ABNORMAL LOW (ref 3.87–5.11)
RDW: 13.7 % (ref 11.5–15.5)
RDW: 13.6 % (ref 11.5–15.5)
WBC: 5.1 10*3/uL (ref 4.0–10.5)

## 2010-12-26 LAB — BASIC METABOLIC PANEL
BUN: 10 mg/dL (ref 6–23)
Chloride: 104 mEq/L (ref 96–112)
GFR calc Af Amer: >90 mL/min (ref >90)
GFR calc non Af Amer: >90 mL/min (ref >90)
Potassium: 3.8 mEq/L (ref 3.5–5.1)
Sodium: 133 mEq/L — ABNORMAL LOW (ref 135–145)

## 2010-12-26 MED ORDER — LACTATED RINGERS IV BOLUS (SEPSIS): 200.0000 mL | Freq: Once | INTRAVENOUS | Status: DC

## 2010-12-26 MED ORDER — PRENATAL MULTIVITAMIN CH: 1.0000 | ORAL_TABLET | Freq: Every day | ORAL | Status: DC

## 2010-12-26 MED ORDER — LACTATED RINGERS IV SOLN
INTRAVENOUS | Status: DC
  Administered 2010-12-26 – 2010-12-27 (×5): via INTRAVENOUS

## 2010-12-26 MED ORDER — ACETAMINOPHEN 325 MG PO TABS
650.0000 mg | ORAL_TABLET | ORAL | Status: DC | PRN
  Administered 2010-12-26 (×2): 650 mg via ORAL
  Filled 2010-12-26 (×2): qty 2

## 2010-12-26 MED ORDER — ONDANSETRON HCL 4 MG/2ML IJ SOLN
4.0000 mg | Freq: Three times a day (TID) | INTRAMUSCULAR | Status: DC | PRN
  Administered 2010-12-26: 4 mg via INTRAVENOUS
  Filled 2010-12-26: qty 2

## 2010-12-26 MED ORDER — CALCIUM CARBONATE ANTACID 500 MG PO CHEW: 2.0000 | CHEWABLE_TABLET | ORAL | Status: DC | PRN

## 2010-12-26 MED ORDER — ZOLPIDEM TARTRATE 5 MG PO TABS
5.0000 mg | ORAL_TABLET | Freq: Every evening | ORAL | Status: DC | PRN
  Administered 2010-12-26 – 2010-12-31 (×6): 5 mg via ORAL
  Filled 2010-12-26 (×6): qty 1

## 2010-12-26 MED ORDER — AMOXICILLIN 875 MG PO TABS
875.0000 mg | ORAL_TABLET | Freq: Two times a day (BID) | ORAL | Status: DC
  Administered 2010-12-26 – 2010-12-30 (×8): 875 mg via ORAL
  Filled 2010-12-26: qty 1

## 2010-12-26 MED ORDER — PRENATAL PLUS 27-1 MG PO TABS
1.0000 | ORAL_TABLET | Freq: Every day | ORAL | Status: DC
  Administered 2010-12-26: 1 via ORAL
  Filled 2010-12-26: qty 1

## 2010-12-26 MED ORDER — NEOMYCIN-COLIST-HC-THONZONIUM 3.3-3-10-0.5 MG/ML OT SUSP
5.0000 [drp] | Freq: Every day | OTIC | Status: DC
  Filled 2010-12-26: qty 5

## 2010-12-26 MED ORDER — LABETALOL HCL 200 MG PO TABS
200.0000 mg | ORAL_TABLET | Freq: Two times a day (BID) | ORAL | Status: DC
  Filled 2010-12-26 (×2): qty 1

## 2010-12-26 MED ORDER — LABETALOL HCL 200 MG PO TABS
200.0000 mg | ORAL_TABLET | Freq: Three times a day (TID) | ORAL | Status: DC
  Administered 2010-12-26 – 2010-12-31 (×15): 200 mg via ORAL
  Filled 2010-12-26 (×16): qty 1

## 2010-12-26 MED ORDER — DOCUSATE SODIUM 100 MG PO CAPS
100.0000 mg | ORAL_CAPSULE | Freq: Every day | ORAL | Status: DC
  Administered 2010-12-26: 100 mg via ORAL
  Filled 2010-12-26: qty 1

## 2010-12-26 MED ORDER — BUTORPHANOL TARTRATE 2 MG/ML IJ SOLN
INTRAMUSCULAR | Status: AC
  Filled 2010-12-26: qty 1

## 2010-12-26 MED ORDER — BUTALBITAL-APAP-CAFFEINE 50-325-40 MG PO TABS
1.0000 | ORAL_TABLET | Freq: Four times a day (QID) | ORAL | Status: DC | PRN
  Administered 2010-12-26 – 2010-12-31 (×3): 2 via ORAL
  Filled 2010-12-26 (×6): qty 2

## 2010-12-26 NOTE — Progress Notes (Signed)
24 hr urine started

## 2010-12-26 NOTE — Progress Notes (Signed)
Back from US.

## 2010-12-26 NOTE — Progress Notes (Signed)
Rec'd report & assumed pt care 

## 2010-12-26 NOTE — Progress Notes (Signed)
Patient ID: TANIEKA POWNALL, female   DOB: [birth year], 30 y.o.   MRN: [id number] Subjective: Slight HA still present but much improved with Tylenol. No SOB/CP. Blurred vision improved. Subjective feeling of decreased urine output.   Objective: Vital signs in last 24 hours: Temp:  [98 F (36.7 C)-98.6 F (37 C)] 98 F (36.7 C) (12/23 0649) Pulse Rate:  [69-85] 72  (12/23 0649) Resp:  [18-20] 18  (12/23 0649) BP: (141-150)/(82-99) 150/82 mmHg (12/23 0649) Weight:  [204 lb (92.534 kg)] 204 lb (92.534 kg) (12/22 2238) Weight change:   Wt gain 7 lbs in 10 days (197 to 204 lbs from 12/13-12/23)  Intake/Output from previous day: 12/22 0701 - 12/23 0700 In: 1200 [P.O.:1200] Out: 100 [Urine:100]  Physical exam:  A&O x 3, no acute distress. Pleasant HEENT neg Lungs CTA bilat, no crackles CV RRR, S1S2 normal Abdo soft, non tender, non acute, palpable UCs,  Extr + edema (ankles) no calf tenderness Pelvic deferred FHT A- 120s/ + accels/ no decels/ moderate variability- Reactive         B- 125/ + accels/ no decels/ moderate variability- reactive.  Toco Irreg, q 3-5 min, mild to palpate.   Lab Results: Basename 12/26/10 0602 12/25/10 2250  WBC 5.1 5.1  HGB 8.7* 8.6*  HCT 26.6* 26.4*  PLT 130* 123*  Stable platelet but decreased from 12/20 value of 140K Anemia   BMET Basename 12/26/10 0602 12/25/10 2250  NA 133* 137  K 3.8 3.7  CL 104 106  CO2 20 22  GLUCOSE 74 100*  BUN 10 11  CREATININE 0.77 0.84  CALCIUM 7.9* 7.8*  AST/ALT from this AM pending.  Uric acid worse from 12/20.   Assessment/Plan: 30 yo, G2P1 at 34.2/7 wks, Di-Di twins (boy, girl) with preeclampsia and now HAs, blurred vision, here for possible severe PEC. Labs stable, nl LDH, but Platelets dropped from 140 to 130K, repeat 24 hr urine protein pending, BPs getting worse (but not in severe category yet)  Repeat labs in 12 hrs and reassess. Daily weights, strict I/O FWB- reassuring with reactive FHT (A,B). Plan  NICU consult, Anesthesia consult. Sono for growth (office sono was scheduled on 12/28 for growth) Consent for c/section and tubal ligation as patient desires permanent sterilization.   Conleigh Heinlein R 12/26/2010, 9:06 AM

## 2010-12-26 NOTE — Progress Notes (Signed)
Taken to U/S by K. Day, OBT

## 2010-12-26 NOTE — Consult Note (Addendum)
Asked by Dr. Juliene Pina to speak to [name] to discuss outcome of preterm infants at 34 wks. Chart reviewed. 34 2/7 wks, twin gestation. Pregnancy by Clomid, di-di, mom was readmitted for preeclampsia. Meds: Labetalol.  Prenatal labs neg with unknown GBS. Platelet counts 123-130,000.  I spoke with [name] and discussed usual clinical course for 34 wk preterms, re: low incidence of RDS but for the most part infants will need level 2 care to monitor and support temp regulation and provide gavage feeding when necessary. Discussed delivery room presence, resuscitation, and various respiratory support if needed. Discussed variation in twins, with twin B usually a little sicker than twin A, possible difference in discharge dates.  Discussed breastfeeding and benefits. Questions answered.  Thank you for inviting [place] to meet this family prior to delivery.  [name] Q  Face to face time: 25 min.

## 2010-12-26 NOTE — Plan of Care (Signed)
Problem: Consults Goal: Birthing Suites Patient Information Press F2 to bring up selections list  Outcome: Completed/Met Date Met:  12/26/10  Pt < [redacted] weeks EGA     

## 2010-12-26 NOTE — Progress Notes (Signed)
Dr mody notified of low urinary output, dr mody reviewed total outputs with the rn, md says she is just going to continue to monitor output for now and wait for 24hr urine results in am

## 2010-12-26 NOTE — Anesthesia Preprocedure Evaluation (Signed)
Anesthesia Evaluation  Patient identified by MRN, date of birth, ID band Patient awake    Reviewed: Allergy & Precautions, H&P , NPO status , Patient's Chart, lab work & pertinent test results  Airway Mallampati: III      Dental No notable dental hx.    Pulmonary neg pulmonary ROS,  clear to auscultation  Pulmonary exam normal       Cardiovascular Exercise Tolerance: Good hypertension, neg cardio ROS regular Normal    Neuro/Psych  Headaches, Negative Neurological ROS  Negative Psych ROS   GI/Hepatic negative GI ROS, Neg liver ROS,   Endo/Other  Negative Endocrine ROSMorbid obesity  Renal/GU negative Renal ROS  Genitourinary negative   Musculoskeletal   Abdominal Normal abdominal exam  (+)   Peds  Hematology negative hematology ROS (+)   Anesthesia Other Findings   Reproductive/Obstetrics (+) Pregnancy                           Anesthesia Physical Anesthesia Plan  ASA: III and Emergent  Anesthesia Plan: Spinal   Post-op Pain Management:    Induction:   Airway Management Planned:   Additional Equipment:   Intra-op Plan:   Post-operative Plan:   Informed Consent: I have reviewed the patients History and Physical, chart, labs and discussed the procedure including the risks, benefits and alternatives for the proposed anesthesia with the patient or authorized representative who has indicated his/her understanding and acceptance.     Plan Discussed with: Anesthesiologist, CRNA and Surgeon  Anesthesia Plan Comments:         Anesthesia Quick Evaluation

## 2010-12-27 ENCOUNTER — Encounter (HOSPITAL_COMMUNITY): Payer: Self-pay | Admitting: Neonatology

## 2010-12-27 ENCOUNTER — Inpatient Hospital Stay (HOSPITAL_COMMUNITY): Payer: 59 | Admitting: Anesthesiology

## 2010-12-27 ENCOUNTER — Encounter (HOSPITAL_COMMUNITY): Payer: Self-pay | Admitting: Anesthesiology

## 2010-12-27 ENCOUNTER — Other Ambulatory Visit: Payer: Self-pay | Admitting: Obstetrics & Gynecology

## 2010-12-27 ENCOUNTER — Encounter (HOSPITAL_COMMUNITY)
Admission: AD | Disposition: A | Discharge status: Home / Self Care | Payer: Self-pay | Source: Ambulatory Visit | Attending: Obstetrics and Gynecology

## 2010-12-27 DIAGNOSIS — O1410 Severe pre-eclampsia, unspecified trimester: Secondary | ICD-10-CM | POA: Diagnosis present

## 2010-12-27 LAB — COMPREHENSIVE METABOLIC PANEL
AST: 31 U/L (ref 0–37)
Albumin: 1.9 g/dL — ABNORMAL LOW (ref 3.5–5.2)
Calcium: 8.4 mg/dL (ref 8.4–10.5)
Creatinine, Ser: 0.8 mg/dL (ref 0.50–1.10)
GFR calc non Af Amer: >90 mL/min (ref >90)
Total Protein: 5.3 g/dL — ABNORMAL LOW (ref 6.0–8.3)

## 2010-12-27 LAB — CBC
MCH: 27.9 pg (ref 26.0–34.0)
MCH: 27.9 pg (ref 26.0–34.0)
MCHC: 32.4 g/dL (ref 30.0–36.0)
MCHC: 32.5 g/dL (ref 30.0–36.0)
MCV: 85.9 fL (ref 78.0–100.0)
Platelets: 122 10*3/uL — ABNORMAL LOW (ref 150–400)
RDW: 13.6 % (ref 11.5–15.5)
RDW: 13.8 % (ref 11.5–15.5)

## 2010-12-27 LAB — BASIC METABOLIC PANEL
BUN: 10 mg/dL (ref 6–23)
Calcium: 7.5 mg/dL — ABNORMAL LOW (ref 8.4–10.5)
GFR calc Af Amer: >90 mL/min (ref >90)
GFR calc non Af Amer: 78 mL/min — ABNORMAL LOW (ref >90)
Glucose, Bld: 76 mg/dL (ref 70–99)
Potassium: 4.4 mEq/L (ref 3.5–5.1)
Sodium: 131 mEq/L — ABNORMAL LOW (ref 135–145)

## 2010-12-27 LAB — MRSA PCR SCREENING: MRSA by PCR: NEGATIVE

## 2010-12-27 SURGERY — Surgical Case
Anesthesia: Spinal | Wound class: Clean Contaminated

## 2010-12-27 MED ORDER — SODIUM CHLORIDE 0.9 % IJ SOLN: 3.0000 mL | INTRAMUSCULAR | Status: DC | PRN

## 2010-12-27 MED ORDER — OXYTOCIN 10 UNIT/ML IJ SOLN
INTRAMUSCULAR | Status: AC
  Filled 2010-12-27: qty 4

## 2010-12-27 MED ORDER — ONDANSETRON HCL 4 MG/2ML IJ SOLN: 4.0000 mg | INTRAMUSCULAR | Status: DC | PRN

## 2010-12-27 MED ORDER — MISOPROSTOL 200 MCG PO TABS
1000.0000 ug | ORAL_TABLET | Freq: Once | ORAL | Status: AC
  Administered 2010-12-27: 1000 ug via RECTAL

## 2010-12-27 MED ORDER — ONDANSETRON HCL 4 MG/2ML IJ SOLN
INTRAMUSCULAR | Status: DC | PRN
  Administered 2010-12-27: 4 mg via INTRAVENOUS

## 2010-12-27 MED ORDER — MISOPROSTOL 200 MCG PO TABS
ORAL_TABLET | ORAL | Status: AC
  Administered 2010-12-27: 1000 ug via RECTAL
  Filled 2010-12-27: qty 5

## 2010-12-27 MED ORDER — NALBUPHINE HCL 10 MG/ML IJ SOLN
5.0000 mg | INTRAMUSCULAR | Status: DC | PRN
  Filled 2010-12-27: qty 1

## 2010-12-27 MED ORDER — PHENYLEPHRINE 40 MCG/ML (10ML) SYRINGE FOR IV PUSH (FOR BLOOD PRESSURE SUPPORT)
PREFILLED_SYRINGE | INTRAVENOUS | Status: AC
  Filled 2010-12-27: qty 5

## 2010-12-27 MED ORDER — ACETAMINOPHEN 10 MG/ML IV SOLN
1000.0000 mg | Freq: Four times a day (QID) | INTRAVENOUS | Status: AC | PRN
  Filled 2010-12-27: qty 100

## 2010-12-27 MED ORDER — TETANUS-DIPHTH-ACELL PERTUSSIS 5-2.5-18.5 LF-MCG/0.5 IM SUSP
0.5000 mL | Freq: Once | INTRAMUSCULAR | Status: DC
  Filled 2010-12-27: qty 0.5

## 2010-12-27 MED ORDER — DIPHENHYDRAMINE HCL 50 MG/ML IJ SOLN: 12.5000 mg | INTRAMUSCULAR | Status: DC | PRN

## 2010-12-27 MED ORDER — SCOPOLAMINE 1 MG/3DAYS TD PT72
1.0000 | MEDICATED_PATCH | Freq: Once | TRANSDERMAL | Status: DC
  Administered 2010-12-27: 1.5 mg via TRANSDERMAL

## 2010-12-27 MED ORDER — BUPIVACAINE IN DEXTROSE 0.75-8.25 % IT SOLN
INTRATHECAL | Status: DC | PRN
  Administered 2010-12-27: 11 mg via INTRATHECAL

## 2010-12-27 MED ORDER — IBUPROFEN 600 MG PO TABS: 600.0000 mg | ORAL_TABLET | Freq: Four times a day (QID) | ORAL | Status: DC | PRN

## 2010-12-27 MED ORDER — PHENYLEPHRINE HCL 10 MG/ML IJ SOLN
INTRAMUSCULAR | Status: DC | PRN
  Administered 2010-12-27: 40 ug via INTRAVENOUS
  Administered 2010-12-27: 80 ug via INTRAVENOUS

## 2010-12-27 MED ORDER — KETOROLAC TROMETHAMINE 30 MG/ML IJ SOLN: 30.0000 mg | Freq: Four times a day (QID) | INTRAMUSCULAR | Status: AC | PRN

## 2010-12-27 MED ORDER — CEFAZOLIN SODIUM 1-5 GM-% IV SOLN
INTRAVENOUS | Status: DC | PRN
  Administered 2010-12-27: 2 g via INTRAVENOUS

## 2010-12-27 MED ORDER — FENTANYL CITRATE 0.05 MG/ML IJ SOLN: 25.0000 ug | INTRAMUSCULAR | Status: DC | PRN

## 2010-12-27 MED ORDER — DIPHENHYDRAMINE HCL 50 MG/ML IJ SOLN: 25.0000 mg | INTRAMUSCULAR | Status: DC | PRN

## 2010-12-27 MED ORDER — SIMETHICONE 80 MG PO CHEW: 80.0000 mg | CHEWABLE_TABLET | ORAL | Status: DC | PRN

## 2010-12-27 MED ORDER — OXYTOCIN 20 UNITS IN LACTATED RINGERS INFUSION - SIMPLE
INTRAVENOUS | Status: DC | PRN
  Administered 2010-12-27 (×2): 20 [IU] via INTRAVENOUS

## 2010-12-27 MED ORDER — SCOPOLAMINE 1 MG/3DAYS TD PT72
MEDICATED_PATCH | TRANSDERMAL | Status: AC
  Administered 2010-12-27: 1.5 mg via TRANSDERMAL
  Filled 2010-12-27: qty 1

## 2010-12-27 MED ORDER — PRENATAL MULTIVITAMIN CH
1.0000 | ORAL_TABLET | Freq: Every day | ORAL | Status: DC
  Administered 2010-12-27 – 2011-01-01 (×5): 1 via ORAL
  Filled 2010-12-27 (×5): qty 1

## 2010-12-27 MED ORDER — ACETAMINOPHEN 325 MG PO TABS: 325.0000 mg | ORAL_TABLET | ORAL | Status: DC | PRN

## 2010-12-27 MED ORDER — SENNOSIDES-DOCUSATE SODIUM 8.6-50 MG PO TABS
2.0000 | ORAL_TABLET | Freq: Every day | ORAL | Status: DC
  Administered 2010-12-27 – 2010-12-31 (×5): 2 via ORAL

## 2010-12-27 MED ORDER — MORPHINE SULFATE (PF) 0.5 MG/ML IJ SOLN
INTRAMUSCULAR | Status: DC | PRN
  Administered 2010-12-27: .1 mg via INTRATHECAL

## 2010-12-27 MED ORDER — OXYTOCIN 20 UNITS IN LACTATED RINGERS INFUSION - SIMPLE
125.0000 mL/h | INTRAVENOUS | Status: AC
  Filled 2010-12-27: qty 1000

## 2010-12-27 MED ORDER — OXYCODONE-ACETAMINOPHEN 5-325 MG PO TABS
1.0000 | ORAL_TABLET | ORAL | Status: DC | PRN
  Administered 2010-12-27: 1 via ORAL
  Administered 2010-12-28 (×3): 2 via ORAL
  Administered 2010-12-28: 1 via ORAL
  Administered 2010-12-28 – 2010-12-29 (×3): 2 via ORAL
  Administered 2010-12-29: 1 via ORAL
  Administered 2010-12-29 – 2010-12-30 (×6): 2 via ORAL
  Filled 2010-12-27 (×3): qty 2
  Filled 2010-12-27: qty 1
  Filled 2010-12-27 (×5): qty 2
  Filled 2010-12-27: qty 1
  Filled 2010-12-27 (×3): qty 2
  Filled 2010-12-27: qty 1
  Filled 2010-12-27: qty 2

## 2010-12-27 MED ORDER — FERROUS SULFATE 325 (65 FE) MG PO TABS
325.0000 mg | ORAL_TABLET | Freq: Two times a day (BID) | ORAL | Status: DC
  Administered 2010-12-27 – 2011-01-01 (×10): 325 mg via ORAL
  Filled 2010-12-27 (×10): qty 1

## 2010-12-27 MED ORDER — FENTANYL CITRATE 0.05 MG/ML IJ SOLN
INTRAMUSCULAR | Status: AC
  Filled 2010-12-27: qty 2

## 2010-12-27 MED ORDER — EPHEDRINE 5 MG/ML INJ
INTRAVENOUS | Status: AC
  Filled 2010-12-27: qty 10

## 2010-12-27 MED ORDER — NALOXONE HCL 0.4 MG/ML IJ SOLN: 0.4000 mg | INTRAMUSCULAR | Status: DC | PRN

## 2010-12-27 MED ORDER — FENTANYL CITRATE 0.05 MG/ML IJ SOLN
INTRAMUSCULAR | Status: AC
  Administered 2010-12-27: 50 ug via INTRAVENOUS
  Filled 2010-12-27: qty 2

## 2010-12-27 MED ORDER — KETOROLAC TROMETHAMINE 30 MG/ML IJ SOLN: 15.0000 mg | Freq: Once | INTRAMUSCULAR | Status: DC | PRN

## 2010-12-27 MED ORDER — FENTANYL CITRATE 0.05 MG/ML IJ SOLN
INTRAMUSCULAR | Status: DC | PRN
  Administered 2010-12-27: 25 ug via INTRATHECAL

## 2010-12-27 MED ORDER — SIMETHICONE 80 MG PO CHEW
80.0000 mg | CHEWABLE_TABLET | Freq: Three times a day (TID) | ORAL | Status: DC
  Administered 2010-12-27 – 2011-01-01 (×14): 80 mg via ORAL

## 2010-12-27 MED ORDER — PROMETHAZINE HCL 25 MG/ML IJ SOLN: 6.2500 mg | INTRAMUSCULAR | Status: DC | PRN

## 2010-12-27 MED ORDER — FENTANYL CITRATE 0.05 MG/ML IJ SOLN
25.0000 ug | INTRAMUSCULAR | Status: DC | PRN
  Administered 2010-12-27 (×2): 50 ug via INTRAVENOUS

## 2010-12-27 MED ORDER — MAGNESIUM SULFATE 40 G IN LACTATED RINGERS - SIMPLE
1.0000 g/h | INTRAVENOUS | Status: DC
  Administered 2010-12-27: 1 g/h via INTRAVENOUS
  Filled 2010-12-27: qty 500

## 2010-12-27 MED ORDER — WITCH HAZEL-GLYCERIN EX PADS: 1.0000 "application " | MEDICATED_PAD | CUTANEOUS | Status: DC | PRN

## 2010-12-27 MED ORDER — DIPHENHYDRAMINE HCL 25 MG PO CAPS: 25.0000 mg | ORAL_CAPSULE | ORAL | Status: DC | PRN

## 2010-12-27 MED ORDER — MORPHINE SULFATE 0.5 MG/ML IJ SOLN
INTRAMUSCULAR | Status: AC
  Filled 2010-12-27: qty 10

## 2010-12-27 MED ORDER — ONDANSETRON HCL 4 MG/2ML IJ SOLN
INTRAMUSCULAR | Status: AC
  Filled 2010-12-27: qty 2

## 2010-12-27 MED ORDER — EPHEDRINE SULFATE 50 MG/ML IJ SOLN
INTRAMUSCULAR | Status: DC | PRN
  Administered 2010-12-27 (×2): 5 mg via INTRAVENOUS
  Administered 2010-12-27: 10 mg via INTRAVENOUS
  Administered 2010-12-27 (×2): 5 mg via INTRAVENOUS

## 2010-12-27 MED ORDER — MEPERIDINE HCL 25 MG/ML IJ SOLN
6.2500 mg | INTRAMUSCULAR | Status: AC | PRN
  Administered 2010-12-27 (×2): 6.25 mg via INTRAVENOUS

## 2010-12-27 MED ORDER — ONDANSETRON HCL 4 MG/2ML IJ SOLN: 4.0000 mg | Freq: Three times a day (TID) | INTRAMUSCULAR | Status: DC | PRN

## 2010-12-27 MED ORDER — ONDANSETRON HCL 4 MG PO TABS: 4.0000 mg | ORAL_TABLET | ORAL | Status: DC | PRN

## 2010-12-27 MED ORDER — MENTHOL 3 MG MT LOZG: 1.0000 | LOZENGE | OROMUCOSAL | Status: DC | PRN

## 2010-12-27 MED ORDER — LACTATED RINGERS IV SOLN
INTRAVENOUS | Status: DC
  Administered 2010-12-27 – 2010-12-29 (×5): via INTRAVENOUS

## 2010-12-27 MED ORDER — CITRIC ACID-SODIUM CITRATE 334-500 MG/5ML PO SOLN
ORAL | Status: AC
  Administered 2010-12-27: 30 mL via ORAL
  Filled 2010-12-27: qty 15

## 2010-12-27 MED ORDER — MEPERIDINE HCL 25 MG/ML IJ SOLN
INTRAMUSCULAR | Status: AC
  Administered 2010-12-27: 6.25 mg via INTRAVENOUS
  Filled 2010-12-27: qty 1

## 2010-12-27 MED ORDER — METOCLOPRAMIDE HCL 5 MG/ML IJ SOLN: 10.0000 mg | Freq: Three times a day (TID) | INTRAMUSCULAR | Status: DC | PRN

## 2010-12-27 MED ORDER — DIBUCAINE 1 % RE OINT: 1.0000 "application " | TOPICAL_OINTMENT | RECTAL | Status: DC | PRN

## 2010-12-27 MED ORDER — DIPHENHYDRAMINE HCL 25 MG PO CAPS: 25.0000 mg | ORAL_CAPSULE | Freq: Four times a day (QID) | ORAL | Status: DC | PRN

## 2010-12-27 MED ORDER — SODIUM CHLORIDE 0.9 % IV SOLN
1.0000 ug/kg/h | INTRAVENOUS | Status: DC | PRN
  Filled 2010-12-27: qty 2.5

## 2010-12-27 MED ORDER — LANOLIN HYDROUS EX OINT: 1.0000 "application " | TOPICAL_OINTMENT | CUTANEOUS | Status: DC | PRN

## 2010-12-27 MED ORDER — OXYTOCIN 20 UNITS IN LACTATED RINGERS INFUSION - SIMPLE
125.0000 mL/h | INTRAVENOUS | Status: DC
  Filled 2010-12-27: qty 1000

## 2010-12-27 MED ORDER — CITRIC ACID-SODIUM CITRATE 334-500 MG/5ML PO SOLN
30.0000 mL | Freq: Once | ORAL | Status: AC
  Administered 2010-12-27: 30 mL via ORAL

## 2010-12-27 MED ORDER — MEPERIDINE HCL 25 MG/ML IJ SOLN: 6.2500 mg | INTRAMUSCULAR | Status: DC | PRN

## 2010-12-27 SURGICAL SUPPLY — 37 items
BENZOIN TINCTURE PRP APPL 2/3 (GAUZE/BANDAGES/DRESSINGS) IMPLANT
CHLORAPREP W/TINT 26ML (MISCELLANEOUS) ×2 IMPLANT
CLOTH BEACON ORANGE TIMEOUT ST (SAFETY) ×2 IMPLANT
CONTAINER PREFILL 10% NBF 15ML (MISCELLANEOUS) IMPLANT
DRESSING TELFA 8X3 (GAUZE/BANDAGES/DRESSINGS) ×2 IMPLANT
ELECT REM PT RETURN 9FT ADLT (ELECTROSURGICAL) ×2
ELECTRODE REM PT RTRN 9FT ADLT (ELECTROSURGICAL) ×1 IMPLANT
EXTRACTOR VACUUM KIWI (MISCELLANEOUS) IMPLANT
EXTRACTOR VACUUM M CUP 4 TUBE (SUCTIONS) IMPLANT
GAUZE SPONGE 4X4 12PLY STRL LF (GAUZE/BANDAGES/DRESSINGS) ×4 IMPLANT
GLOVE BIO SURGEON STRL SZ7 (GLOVE) ×2 IMPLANT
GLOVE BIOGEL PI IND STRL 7.0 (GLOVE) ×2 IMPLANT
GLOVE BIOGEL PI INDICATOR 7.0 (GLOVE) ×2
GOWN PREVENTION PLUS LG XLONG (DISPOSABLE) ×6 IMPLANT
KIT ABG SYR 3ML LUER SLIP (SYRINGE) IMPLANT
NEEDLE HYPO 25X5/8 SAFETYGLIDE (NEEDLE) IMPLANT
NS IRRIG 1000ML POUR BTL (IV SOLUTION) ×2 IMPLANT
PACK C SECTION WH (CUSTOM PROCEDURE TRAY) ×2 IMPLANT
PAD ABD 7.5X8 STRL (GAUZE/BANDAGES/DRESSINGS) ×2 IMPLANT
RTRCTR C-SECT PINK 25CM LRG (MISCELLANEOUS) IMPLANT
SLEEVE SCD COMPRESS KNEE MED (MISCELLANEOUS) IMPLANT
STAPLER VISISTAT 35W (STAPLE) ×2 IMPLANT
STRIP CLOSURE SKIN 1/4X4 (GAUZE/BANDAGES/DRESSINGS) IMPLANT
SUT MNCRL 0 VIOLET CTX 36 (SUTURE) ×3 IMPLANT
SUT MONOCRYL 0 CTX 36 (SUTURE) ×3
SUT PLAIN 0 NONE (SUTURE) IMPLANT
SUT PLAIN 2 0 (SUTURE)
SUT PLAIN ABS 2-0 CT1 27XMFL (SUTURE) IMPLANT
SUT VIC AB 0 CT1 27 (SUTURE) ×2
SUT VIC AB 0 CT1 27XBRD ANBCTR (SUTURE) ×2 IMPLANT
SUT VIC AB 2-0 CT1 27 (SUTURE) ×2
SUT VIC AB 2-0 CT1 TAPERPNT 27 (SUTURE) ×2 IMPLANT
SUT VIC AB 4-0 KS 27 (SUTURE) IMPLANT
SUT VICRYL 0 TIES 12 18 (SUTURE) IMPLANT
TOWEL OR 17X24 6PK STRL BLUE (TOWEL DISPOSABLE) ×4 IMPLANT
TRAY FOLEY CATH 14FR (SET/KITS/TRAYS/PACK) IMPLANT
WATER STERILE IRR 1000ML POUR (IV SOLUTION) ×2 IMPLANT

## 2010-12-27 NOTE — Consult Note (Signed)
Requested to attend primary C/S for maternal interest with worsening pre-eclampsia but without HEELP syndrome at 34 wks 3 days. Mother has no other risk factors other than premature rupture of membranes associated with delivery and negative GBS though on amoxicilin for otitis.    At delivery infant B was in vertex as had been twin A; both infants had AROM at time of delivery and in each instance, fluid was clear .  Twin B was delivered has spontaneous movement of all extremities and cries; given tactile stim and bulb suction of naso/oropharynx yielding minimal fluid. There are no dysmorphic features. She required one minute of BBO2 for slow response in central color to pinken up but was on room air  through transport to NICU.  I.    Infant appeared to be < 2000 kg and it was anticipated she would require NICU admission on this basis alone.   On exam there are plantar creases beyond the anterior third are present and the aroleae are well developed but flat.    Shown to parents and then placed into Transport [place]so[place] and transported to NICU with father accompanying. Arrival was at 0956 hrs.    Care to [facility].  Dagoberto Ligas MD [facility]

## 2010-12-27 NOTE — Anesthesia Procedure Notes (Signed)
Spinal  Patient location during procedure: OR Start time: 12/27/2010 9:24 AM Staffing Anesthesiologist: Brayton Caves R Performed by: anesthesiologist  Preanesthetic Checklist Completed: patient identified, site marked, surgical consent, pre-op evaluation, timeout performed, IV checked, risks and benefits discussed and monitors and equipment checked Spinal Block Patient position: sitting Prep: DuraPrep Patient monitoring: heart rate, cardiac monitor, continuous pulse ox and blood pressure Approach: midline Location: L3-4 Injection technique: single-shot Needle Needle type: Sprotte  Needle gauge: 24 G Needle length: 9 cm Assessment Sensory level: T4 Additional Notes Patient identified.  Risk benefits discussed including failed block, incomplete pain control, headache, nerve damage, paralysis, blood pressure changes, nausea, vomiting, reactions to medication both toxic or allergic, and postpartum back pain.  Patient expressed understanding and wished to proceed.  All questions were answered.  Sterile technique used throughout procedure.  CSF was clear.  No parasthesia or other complications.  Please see nursing notes for vital signs.

## 2010-12-27 NOTE — Anesthesia Postprocedure Evaluation (Signed)
Anesthesia Post Note  Patient: [name]  Procedure(s) Performed:  CESAREAN SECTION - Primary Cesarian Section with Bilateral Tubal Ligations; Twin Birth  Anesthesia type: Epidural  Patient location: Mother/Baby  Post pain: Pain level controlled  Post assessment: Post-op Vital signs reviewed  Last Vitals:  Filed Vitals:   12/27/10 1100  BP:   Pulse:   Temp:   Resp: 20    Post vital signs: Reviewed  Level of consciousness: awake  Complications: No apparent anesthesia complications

## 2010-12-27 NOTE — Progress Notes (Signed)
Patient ID: [name], female   DOB: [birth year], 30 y.o.   MRN: [id number] Subjective: HA continues, improved with Fiorecet. Some blurred vision. Swelling +++. No epig pain. Good FMs, no vag blding.   Objective: Vital signs in last 24 hours: Temp:  [97.8 F (36.6 C)-98.8 F (37.1 C)] 98.8 F (37.1 C) (12/24 0458) Pulse Rate:  [69-82] 71  (12/24 0655) Resp:  [18-20] 18  (12/24 0458) BP: (129-163)/(78-101) 152/93 mmHg (12/24 0655) Weight change:    Intake/Output from previous day: 12/23 0701 - 12/24 0700 In: 3546.7 [P.O.:1380; I.V.:2166.7] Out: 455 [Urine:455]   Physical exam:  A&O x 3, no acute distress. Pleasant HEENT ne Lungs CTA bilat CV RRR, S1S2 normal Abdo soft, non tender, non acute Extr no tenderness, DTR +2. Edema +3 bilat.  FHT  A - 120-125/ reactive          B- 125-130/ Reactive Toco mild contractions.   Lab Results:  [facility] 12/27/10 0558 12/26/10 1352  WBC 5.6 5.3  HGB 8.5* 8.7*  HCT 26.2* 27.2*  PLT 122* 129*   BMET  Basename 12/27/10 0558 12/26/10 1352  NA 135 135  K 3.7 3.7  CL 105 104  CO2 21 23  GLUCOSE 64* 81  BUN 12 11  CREATININE 0.80 0.80  CALCIUM 8.4 8.1*   Assessment/Plan: 30 yo, G2P1001, at 34.3/7 wks with Severe Preeclampsia, Oliguria, HA, blurred vision, no improvement in urine output despite IV hydration, strict bedrest. No e/o HELLP.  Prematurity, sono yesterday, but 34+ wks.  Considering maternal morbidity and considering Twins at 34+ wks, will proceed with delivery. Desires elective c/section and bilateral tubal ligation. Risks/ complications d/w pt, understands and agrees. D/w Dr Rachel Bo on phone (MFM consultant on call), agrees with decision process.  Will start magnesium in PACU, admit to ICU, freqnt magnesium level checks due to severe oliguria.   V.Somaly Marteney, MD Jenavive Lamboy R 12/27/2010, 9:14 AM

## 2010-12-27 NOTE — Consult Note (Signed)
Requested to attend primary C/S for maternal interest with worsening pre-eclampsia but without HEELP syndrome at 34 wks 3 days. Mother has no other risk factors other than premature rupture of membranes associated with delivery and negative GBS though on amoxicilin for otitis.    At delivery infant A was in vertex as was twin B; both infants had AROM at time of delivery and in each instance, fluid was clear .  Twin A was delivered has spontaneous movement of all extremities and cry when placed under radiant warmer; given tactile stim and bulb suction of naso/oropharynx yielding minimal fluid. There are no dysmorphic features.  A brief interval of grunting respirations and hypoventilation responded to tactile stimulation and resolved and remained so through transport to NICU.  Infant voided under radiant warmer.    Infant appeared to be > 2000 kg and there was consideration of admitting to Transitional Nursery but with the above transition difficulty, chose instead to transport to NICU.  Plantar creases beyond the anterior third are present and the aroleae are well developed but flat.    Shown to parents and then placed into Transport [facility] and transported to NICU with father accompanying. Arrival was at 0956 hrs.    Care to [facility].  Dagoberto Ligas MD [facility]

## 2010-12-27 NOTE — Addendum Note (Signed)
Addendum  created 12/27/10 1213 by Sandrea Hughs., MD   Modules edited:Orders, PRL Based Order Sets

## 2010-12-27 NOTE — Transfer of Care (Signed)
Immediate Anesthesia Transfer of Care Note  Patient: [name]  Procedure(s) Performed:  CESAREAN SECTION - Primary Cesarian Section with Bilateral Tubal Ligations; Twin Birth  Patient Location: PACU  Anesthesia Type: Spinal  Level of Consciousness: awake, alert  and oriented  Airway & Oxygen Therapy: Patient Spontanous Breathing  Post-op Assessment: Report given to PACU RN and Post -op Vital signs reviewed and stable  Post vital signs: Reviewed and stable  Complications: No apparent anesthesia complications

## 2010-12-27 NOTE — Op Note (Addendum)
Primary Cesarean Section and Bilateral Tubal Ligation Procedure Note   [name]  Indications: 34.3/7 wks, Di-Di Twins (Clomid pregnancy) with Severe Preeclampsia, Oliguria.    Pre-operative Diagnosis: Severe Preeclampsia, 34.3/7 wks, Twins  Post-operative Diagnosis: Same    Surgeon:   Mary Fries, MD   Assistants: Mary Fordyce, MD  Anesthesia:  Spinal  Procedure Details:  The patient was evaluated in Antenatal room. Admitted for over 24 hrs for Preeclampsia, now Severe Preclampsia was diagnosed and considering maternal condition and gestational age of Twins, plan was to proceed with Urgent cesarean delivery. The risks, benefits, complications, treatment options, and expected outcomes were discussed with the patient. The patient concurred with the proposed plan, giving informed consent. identified as [name] and the procedure verified as C-Section Delivery and Bilateral Tubal ligation. She was brought to the OR with IV and a Time Out was held and the above information confirmed. She received 2 gm Ancef pre-op.  After induction of Spinal anesthesia, the patient was prepped, draped with foley in place. A Pfannenstiel incision was made and carried down through the subcutaneous tissue to the fascia. Fascial incision was made and extended transversely. The fascia was separated from the underlying rectus tissue superiorly and inferiorly. The peritoneum was identified and entered. Peritoneal incision was extended longitudinally. The utero-vesical peritoneal reflection was incised transversely and a low transverse uterine incision was made. Baby A was BOY, delivered Cephalic at 9.42 am and with vigorous cry, handed to NICU team. Baby B was GIRL, in transverse lie, delivered via Complete Breech Extraction at 9.44 am and handed to NICU team. Cord blood of both twins sent to lab. Placentas were removed with massage and traction, appeared normal, intact and sent to Pathology. Uterus was  exteriorized. The uterine outline, tubes and ovaries appeared normal}. The uterine incision was closed with running locked sutures of 0Vicryl followed by 2nd imbricating layer.  Hemostasis was observed. Lavage was carried out until clear. Bilateral Tubal ligation was performed via Modified Pomeroy;s method with 2-0 Plain and cut segments were sent to Pathology. Uterus was repositioned and peritoneal gutters were cleaned. Tubal ligation sites were hemostatic and sutures were intact. Peritoneum was closed using 2-0 Vicryl. The fascia w then reapproximated with running sutures of 0Vicryl. The subcutaneous closure was performed using 2-0plain gut. The skin was closed with staple. Sterile pressure dressing applied.   Instrument, sponge, and needle counts were correct prior the abdominal closure and were correct at the conclusion of the case.   Findings: Twin A BOY Cephalic at 9.42 am (apgars 7/8) and twin B GIRL, Complete Breech Extraction at 9.44 am (apgars 8/9) on 12/27/10. Both babies cried well at birth, brought to NICU stable. Placenta (two) sent to Path. Normal tubes and ovaries.    Estimated Blood Loss: 1100cc    Total IV Fluids: 1300 cc LR  Urine Output: 100 cc, cloudy  Specimens: Both placentas, cut segments of both fallopian tubes.   Complications: None  Disposition: AICU, watch strict I/O and will start Magnesium sulfate infusion.    Maternal Condition: Stable   Baby condition / location:  Stable, both to NICU.   Mary Zenon, MD.   Signed: Surgeon(s): [name]

## 2010-12-28 LAB — COMPREHENSIVE METABOLIC PANEL
AST: 36 U/L (ref 0–37)
Albumin: 1.5 g/dL — ABNORMAL LOW (ref 3.5–5.2)
BUN: 9 mg/dL (ref 6–23)
Chloride: 103 mEq/L (ref 96–112)
Creatinine, Ser: 0.91 mg/dL (ref 0.50–1.10)
Potassium: 4.4 mEq/L (ref 3.5–5.1)
Total Protein: 4 g/dL — ABNORMAL LOW (ref 6.0–8.3)

## 2010-12-28 LAB — CBC
HCT: 22.8 % — ABNORMAL LOW (ref 36.0–46.0)
MCHC: 32 g/dL (ref 30.0–36.0)
Platelets: 108 10*3/uL — ABNORMAL LOW (ref 150–400)
RDW: 14.1 % (ref 11.5–15.5)
WBC: 8.6 10*3/uL (ref 4.0–10.5)

## 2010-12-28 LAB — RPR: RPR Ser Ql: NONREACTIVE

## 2010-12-28 MED ORDER — FUROSEMIDE 10 MG/ML IJ SOLN
20.0000 mg | Freq: Once | INTRAMUSCULAR | Status: AC
  Administered 2010-12-28: 20 mg via INTRAVENOUS
  Filled 2010-12-28: qty 2

## 2010-12-28 NOTE — Consult Note (Signed)
Information given to mother about the importance of regular pumping to stimulate her milk supply.  [name] has only pumped once yesterday, but is feeling up to starting more regular pumping today.  Encouraged every 2-3 hrs, for 15-20 mins.  Talked about pump rental program, Breast feeding support group, outpatient appointments etc.  Reinforced milk collection, storage, and transporting to [name].  Mom verbalized understanding.  To follow-up daily while in hospital.

## 2010-12-28 NOTE — Addendum Note (Signed)
Addendum  created 12/28/10 0829 by Suella Grove   Modules edited:Notes Section

## 2010-12-28 NOTE — Progress Notes (Addendum)
Subjective: POD# 1 AICU Information for the patient's newborn:  Caliann, Leckrone [[id number]]  female Information for the patient's newborn:  Oliveah, Zwack [[id number]]  female  34+ wks twins stable in NICU  Reports feeling better today, denies PEC S/S Feeding: breast / pumping Patient reports tolerating PO.  Breast symptoms: none Pain controlled with Motrin and Percocet Denies HA/SOB/C/P/N/V/dizziness. Flatus absent. She reports vaginal bleeding as normal, without clots. No ambulation yet, foley cath in place.   Mag Sulfate at 1 gm/hr Objective:   VS:  Filed Vitals:   12/28/10 0529 12/28/10 0600 12/28/10 0700 12/28/10 0800  BP:  128/77 122/76   Pulse:    66  Temp:    97.7 F (36.5 C)  TempSrc:    Oral  Resp:  20    Height:      Weight: 89.858 kg (198 lb 1.6 oz)     SpO2:    96%     Intake/Output Summary (Last 24 hours) at 12/28/10 0847 Last data filed at 12/28/10 0800  Gross per 24 hour  Intake 5517.15 ml  Output   3615 ml  Net 1902.15 ml     avg urine output 250cc/hr for past 4 hours  Mag level 3.6  Basename 12/28/10 0515 12/27/10 1620  WBC 8.6 9.6  HGB 7.3* 7.4*  HCT 22.8* 22.8*  PLT 108* 117*   Lab Results  Component Value Date   ALT 26 12/28/2010   AST 36 12/28/2010   ALKPHOS 133* 12/28/2010   BILITOT 0.2* 12/28/2010     Blood type: --/--/B POS, B POS (12/23 1020)  Rubella: Immune (07/05 0000)     Physical Exam:  General: alert, cooperative, no distress and moderately obese, generalize edema CV: Regular rate and rhythm Resp: (+) crackles bilateral bases Abdomen: soft, nontender, normal bowel sounds Incision: clean, dry, intact and dressing in place Uterine Fundus: firm, below umbilicus, nontender Lochia: minimal Ext: edema +2 up to thighs, Homans sign is negative, no sign of DVT, DTR's +2      Assessment/Plan: 30 y.o.   POD# 1.  s/p Cesarean Delivery 34 4/7 wks.  Indications: Twins, severe PEC                 Active Problems:  Twin  pregnancy, twins dichorionic and diamniotic  Severe preeclampsia - stable but small drop in platelets  - moderate diuresis, and (+) crackles in lung bases, at risk for PE - continue on Mag Sulfate for now until increased diuresis  - Lasix 20 mg IVP x 1  - BP stable on Labetalol - continue Severe maternal anemia  Asymptomatic, started oral Fe Rpt CBC in AM           Ambulate, encourage incentive spirometer Routine post-op care  Dr. Cherly Hensen updated  [facility] 12/28/2010, 8:47 AM

## 2010-12-28 NOTE — Anesthesia Postprocedure Evaluation (Signed)
  Anesthesia Post-op Note  Patient: [name]  Procedure(s) Performed:  CESAREAN SECTION - Primary Cesarian Section with Bilateral Tubal Ligations; Twin Birth  Patient Location: A-ICU  Anesthesia Type: Spinal  Level of Consciousness: awake  Airway and Oxygen Therapy: Patient Spontanous Breathing  Post-op Pain: mild  Post-op Assessment: Patient's Cardiovascular Status Stable, Respiratory Function Stable and Adequate PO intake  Post-op Vital Signs: Reviewed and stable  Complications: No apparent anesthesia complications

## 2010-12-29 LAB — PROTEIN, URINE, 24 HOUR
Collection Interval-UPROT: 24 hours
Protein, 24H Urine: 1496 mg/d — ABNORMAL HIGH (ref 50–100)
Protein, 24H Urine: 2013 mg/d — ABNORMAL HIGH (ref 50–100)
Protein, Urine: 161 mg/dL
Protein, Urine: 374 mg/dL

## 2010-12-29 LAB — CBC
HCT: 20.1 % — ABNORMAL LOW (ref 36.0–46.0)
HCT: 21.7 % — ABNORMAL LOW (ref 36.0–46.0)
Hemoglobin: 7 g/dL — ABNORMAL LOW (ref 12.0–15.0)
MCH: 27.9 pg (ref 26.0–34.0)
MCH: 28 pg (ref 26.0–34.0)
MCHC: 31.8 g/dL (ref 30.0–36.0)
MCHC: 32.3 g/dL (ref 30.0–36.0)
MCV: 87.8 fL (ref 78.0–100.0)
MCV: 86.8 fL (ref 78.0–100.0)
Platelets: 124 10*3/uL — ABNORMAL LOW (ref 150–400)
Platelets: 121 10*3/uL — ABNORMAL LOW (ref 150–400)
RBC: 2.5 MIL/uL — ABNORMAL LOW (ref 3.87–5.11)
RDW: 14.7 % (ref 11.5–15.5)
RDW: 14.6 % (ref 11.5–15.5)
WBC: 9.1 10*3/uL (ref 4.0–10.5)
WBC: 9.4 10*3/uL (ref 4.0–10.5)

## 2010-12-29 LAB — COMPREHENSIVE METABOLIC PANEL
AST: 22 U/L (ref 0–37)
Albumin: 1.5 g/dL — ABNORMAL LOW (ref 3.5–5.2)
BUN: 9 mg/dL (ref 6–23)
Calcium: 7.3 mg/dL — ABNORMAL LOW (ref 8.4–10.5)
Creatinine, Ser: 0.88 mg/dL (ref 0.50–1.10)

## 2010-12-29 NOTE — Progress Notes (Signed)
Patient ID: [name], female   DOB: 08/07/1980, 30 y.o.   MRN: [id number] Subjective: Feels well, no depression. No SOB/CP/LE pain. Swelling decreasing. Lochia minimal. No dizziness. Some incisional pain when ambulates. No BM for several days but flatus+ and no nausea/vomiting, tolerating general diet.   Objective: Vital signs in last 24 hours: Temp:  [97.5 F (36.4 C)-98.1 F (36.7 C)] 98 F (36.7 C) (12/26 0800) Pulse Rate:  [68-82] 74  (12/26 0700) Resp:  [18-20] 18  (12/26 0700) BP: (108-139)/(59-91) 122/84 mmHg (12/26 0700) SpO2:  [91 %-96 %] 95 % (12/26 0700) Weight change:  Last BM Date: 12/26/10  Intake/Output from previous day: 12/25 0701 - 12/26 0700 In: 5780 [P.O.:1780; I.V.:3300] Out: 5450 [Urine:5450]  Physical exam:  A&O x 3, no acute distress. Pleasant HEENT neg, no thyromegaly Lungs CTA bilat, no crackles today.  CV RRR, S1S2 normal Abdo soft, non tender, active BS. Non acute. Dressing +, needs removal today.  Extr +2 edema, no tenderness  Lab Results:  Basename 12/29/10 0820 12/29/10 0515  WBC 9.4 9.1  HGB 7.0* 6.4*  HCT 21.7* 20.1*  PLT 121* 124*   BMET  Basename 12/29/10 0515 12/28/10 0515  NA 138 133*  K 3.9 4.4  CL 106 103  CO2 26 24  GLUCOSE 88 64*  BUN 9 9  CREATININE 0.88 0.91  CALCIUM 7.3* 7.4*   Assessment/Plan:HD #4, POD # 2 Preeclampsia - improving, d/c magnesium at 7.30 am, will transfer out of ICU at 11.30am.  Labs stable, platelets improving, liver enzymes and creatinine stable, decreasing. H/H low (anemia, 6.4 from 5 am lab and repeat at 7), not symptomatic, will hold of transfusion, assess symptoms and repeat labs 12/27 am. BP well controlled on Labetalol 200 mg tid, will continue for now.  Pulmonary edema resolved after 1 dose of Lasix yesterday, diuresed well.  Twins are stable in NICU. Pt working with Advertising copywriter to aid pumping and plan to do same at home.  No PP depression.   [name] R 12/29/2010, 9:30  AM

## 2010-12-29 NOTE — Progress Notes (Signed)
Co dysuria when voiding.  States, "It feels like I have a UTI."  Will alert Dr. Juliene Pina.

## 2010-12-29 NOTE — Progress Notes (Signed)
UR chart review completed.  

## 2010-12-30 ENCOUNTER — Encounter (HOSPITAL_COMMUNITY): Payer: Self-pay | Admitting: Obstetrics & Gynecology

## 2010-12-30 LAB — CBC
HCT: 20.3 % — ABNORMAL LOW (ref 36.0–46.0)
Hemoglobin: 6.4 g/dL — CL (ref 12.0–15.0)
MCHC: 31.5 g/dL (ref 30.0–36.0)
MCV: 88.6 fL (ref 78.0–100.0)
RDW: 15.2 % (ref 11.5–15.5)
WBC: 8 10*3/uL (ref 4.0–10.5)

## 2010-12-30 MED ORDER — BISACODYL 10 MG RE SUPP: 10.0000 mg | Freq: Once | RECTAL | Status: DC

## 2010-12-30 MED ORDER — DOCUSATE SODIUM 100 MG PO CAPS
100.0000 mg | ORAL_CAPSULE | Freq: Two times a day (BID) | ORAL | Status: DC
  Administered 2010-12-30 – 2011-01-01 (×4): 100 mg via ORAL
  Filled 2010-12-30 (×4): qty 1

## 2010-12-30 MED ORDER — OXYCODONE HCL 5 MG PO TABS
10.0000 mg | ORAL_TABLET | ORAL | Status: DC | PRN
  Administered 2010-12-30: 10 mg via ORAL
  Administered 2010-12-31: 5 mg via ORAL
  Administered 2010-12-31 (×2): 10 mg via ORAL
  Filled 2010-12-30: qty 2
  Filled 2010-12-30 (×3): qty 1
  Filled 2010-12-30 (×2): qty 2

## 2010-12-30 NOTE — Progress Notes (Signed)
Hgb. Is 6.4 this am  Pt.  Up oob tol. Well no dizziness hgb was 6.4 on 12/26 at 0500 and 7.0 pm of 12/26 vag. Bleeding wnl

## 2010-12-30 NOTE — Progress Notes (Signed)
PSYCHOSOCIAL ASSESSMENT ~ MATERNAL/CHILD Name: [name] and [name]                                                                                 Age: 30   Referral Date: 12/30/10   Reason/Source: NICU support  I. FAMILY/HOME ENVIRONMENT A. Child's Legal Guardian __x_Parent(s) ___Grandparent ___Foster parent ___DSS_________________ Name: Job Founds                                          DOB: Jun 03, 1980                     Age: 41  Address: [address], [place], [place] [address]  Name: [place] Costanza                                            DOB: //                              Age:   Address: same  B. Other Household Members/Support Persons Name: Damien (9)                      Relationship: brother           DOB 12/27/01                   Name:                                         Relationship:                        DOB ___/___/___                   Name:                                         Relationship:                        DOB ___/___/___                   Name:                                         Relationship:                        DOB ___/___/___  C. Other Support: good support system   II. PSYCHOSOCIAL DATA A. Information Source  _x_Patient Interview  _x_Family Interview           _x_Other: chart  B. 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 STRENGTHS _x__Supportive  family/friends _x__Adequate Resources _x__Compliance with medical plan _x__Home prepared for Child (including basic supplies) _x__Understanding of illness      _x__Other: Pediatric follow up will be at [facility] IV. RISK FACTORS AND CURRENT PROBLEMS         __x__No Problems Noted                                                                                                                                                                                                                                                Pt              Family         Substance Abuse                                                                   ___              ___  Mental Illness                                                                        ___              ___  Family/Relationship Issues                                      ___               ___             Abuse/Neglect/Domestic Violence                                         ___         ___  Financial Resources                                        ___              ___             Transportation                                                                        ___               ___  DSS Involvement                                                                   ___              ___  Adjustment to Illness                                                               ___              ___  Knowledge/Cognitive Deficit                                                   ___              ___  Compliance with Treatment                                                 ___              ___  Basic Needs (food, housing, etc.)                                          ___              ___             Housing Concerns                                       ___              ___ Other_____________________________________________________________            V. SOCIAL WORK ASSESSMENT SW met with parents in MOB's third floor room to  introduce myself, complete assessment and evaluate how they are coping with babies' admissions to NICU.  Parents were very friendly and seem to be coping extremely well with the situation.  MOB states that it has been hard to have had such an easy pregnancy with her first child and then such a difficult time with this one, but since she had severe preeclampsia, she was prepared for the twins to be born prematurely.  Parents report that they have all necessary baby supplies at home and have a great support system of family and friends close by.  They live in [place], but state that they will be here frequently with no issues with transportation.  SW informed them on gas cards and [facility] is needed.  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  FOB states that he thinks she should not bother and just stay home with the babies.  She seems happy with this, although I do not know if they have come to a final decision regarding this matter.  Parents seem to have a good understanding of the situation and state no questions or needs at this time.  SW explained support services offered by NICU SWs and gave contact information.  VI. SOCIAL WORK PLAN  ___No Further Intervention Required/No Barriers to Discharge   _x__Psychosocial Support and Ongoing Assessment of Needs   ___Patient/Family Education:   ___Child Protective Services Report   County___________ Date___/____/____   ___Information/Referral to [employer]_________________________   ___Other:

## 2010-12-30 NOTE — Progress Notes (Signed)
Patient ID: [name], female   DOB: 15-Feb-1980, 30 y.o.   MRN: [id number]  POD # 3  Subjective: Pt reports feeling well; sore/ Pain controlled with prescription NSAID's including motrin and narcotic analgesics including percocet Tolerating po/Voiding without problems/ No n/v/Flatus pos Activity: out of bed and ambulate Bleeding is spotting Easily tearful, but feels "doing well" emotionally. Newborn info:  Information for the patient's newborn:  Cathryn, Gallery [[id number]]  female Information for the patient's newborn:  Gioia, Ranes [[id number]]  female / Feeding: breast/Infants stable in NICU   Objective: VS: Blood pressure 131/86; Range 122-145/84-92 Pulse 72, temperature 98.4 F (36.9 C), temperature source Oral, resp. rate 18  Hgb/Hct:  6.4/20.3  (6.4/20.1 on 12/29/10) PLT: 136  (124 on 12/29/10)   Physical Exam:  General: alert, cooperative and no distress CV: Regular rate and rhythm Resp: clear Abdomen: soft, nontender, normal bowel sounds Incision: healing well, no erythema, well approximated Uterine Fundus: firm, below umbilicus, nontender Lochia: minimal Ext: edema +2 to +3 pedal and pretib and Homans sign is negative, no sign of DVT    A/P: POD # 3/ [id number] Hx severe PEC; BP's stable; no PEC symptoms; remains on labetalol TID ABL Anemia.  Hgb stable this am @ 6.4; plt 136 Will cont to monitor BP and status  Signed: Arlana Lindau [facility] [place] 12/30/10 1045

## 2010-12-31 ENCOUNTER — Encounter (HOSPITAL_COMMUNITY)
Admission: RE | Admit: 2010-12-31 | Discharge: 2010-12-31 | Disposition: A | Discharge status: Home / Self Care | Payer: 59 | Source: Ambulatory Visit | Attending: Obstetrics & Gynecology | Admitting: Obstetrics & Gynecology

## 2010-12-31 DIAGNOSIS — O923 Agalactia: Secondary | ICD-10-CM | POA: Insufficient documentation

## 2010-12-31 LAB — URINALYSIS, ROUTINE W REFLEX MICROSCOPIC
Bilirubin Urine: NEGATIVE
Glucose, UA: NEGATIVE mg/dL
Ketones, ur: NEGATIVE mg/dL
Leukocytes, UA: NEGATIVE
Protein, ur: NEGATIVE mg/dL

## 2010-12-31 LAB — URINE MICROSCOPIC-ADD ON

## 2010-12-31 MED ORDER — ACETAMINOPHEN 325 MG PO TABS
650.0000 mg | ORAL_TABLET | Freq: Once | ORAL | Status: AC
  Administered 2010-12-31: 650 mg via ORAL
  Filled 2010-12-31: qty 2

## 2010-12-31 MED ORDER — SERTRALINE HCL 50 MG PO TABS
50.0000 mg | ORAL_TABLET | Freq: Every day | ORAL | Status: DC
  Administered 2010-12-31 – 2011-01-01 (×2): 50 mg via ORAL
  Filled 2010-12-31 (×3): qty 1

## 2010-12-31 MED ORDER — NITROFURANTOIN MONOHYD MACRO 100 MG PO CAPS
100.0000 mg | ORAL_CAPSULE | Freq: Two times a day (BID) | ORAL | Status: DC
  Administered 2010-12-31 – 2011-01-01 (×3): 100 mg via ORAL
  Filled 2010-12-31 (×5): qty 1

## 2010-12-31 MED ORDER — NIFEDIPINE ER 30 MG PO TB24
30.0000 mg | ORAL_TABLET | Freq: Every day | ORAL | Status: DC
  Administered 2010-12-31: 30 mg via ORAL
  Filled 2010-12-31 (×2): qty 1

## 2010-12-31 MED ORDER — DIPHENHYDRAMINE HCL 25 MG PO CAPS
25.0000 mg | ORAL_CAPSULE | Freq: Once | ORAL | Status: AC
  Administered 2010-12-31: 25 mg via ORAL
  Filled 2010-12-31: qty 1

## 2010-12-31 MED ORDER — FUROSEMIDE 10 MG/ML IJ SOLN
20.0000 mg | Freq: Once | INTRAMUSCULAR | Status: AC
  Administered 2010-12-31: 20 mg via INTRAVENOUS
  Filled 2010-12-31: qty 2

## 2010-12-31 MED ORDER — HYDROCHLOROTHIAZIDE 12.5 MG PO CAPS
12.5000 mg | ORAL_CAPSULE | Freq: Every day | ORAL | Status: DC
  Administered 2010-12-31: 12.5 mg via ORAL
  Filled 2010-12-31 (×2): qty 1

## 2010-12-31 NOTE — Progress Notes (Signed)
Subjective: POD# 4 Information for the patient's newborn:  Deseri, Loss [[id number]]  female Information for the patient's newborn:  Varnika, Butz [[id number]]  female   Reports feeling better since seeing infants in NICU this AM, but overwhelmed with "abnormal" experience of PEC and NICU infants, has been crying throughout the morning. Feeding: breast / pumping Denies PEC s/s, up ad lib, no dizziness or SOB, easily fatigued w/ ambulation, has blood ordered, has not started transfusion yet. C/O burning with urination since yesterday, (+) frequency . Dr. Juliene Pina seen patient this AM, new antihypertensive management and blood transfusion orders. Objective:   VS:  Filed Vitals:   12/30/10 2128 12/31/10 0135 12/31/10 0616 12/31/10 0958  BP: 151/93 149/87 153/95 151/98  Pulse: 84 77 80 83  Temp: 98.7 F (37.1 C) 98.6 F (37 C) 98.7 F (37.1 C) 99.1 F (37.3 C)  TempSrc: Oral Oral Oral Oral  Resp: 18 18 18 20   Height:      Weight:      SpO2: 96% 96% 96% 96%     Intake/Output Summary (Last 24 hours) at 12/31/10 1221 Last data filed at 12/31/10 0750  Gross per 24 hour  Intake    360 ml  Output   3450 ml  Net  -3090 ml        Basename 12/30/10 0545 12/29/10 0820  WBC 8.0 9.4  HGB 6.4* 7.0*  HCT 20.3* 21.7*  PLT 136* 121*     Blood type: --/--/B POS, B POS (12/23 1020)  Rubella: Immune (07/05 0000)     Physical Exam:  General: alert, cooperative, moderately obese and easily teary while discussing newborns CV: Regular rate and rhythm Resp: clear Abdomen: soft, nontender, normal bowel sounds Incision: clean, dry, intact and staples intact Uterine Fundus: firm, below umbilicus, nontender Lochia: minimal Ext: edema +2 pedal and pretibial and Homans sign is negative, no sign of DVT      Assessment/Plan: 30 y.o.  status post Cesarean section. POD# 4.  s/p Cesarean Delivery.  Indications: severe PEC with oliguria                Principal Problem:  *PP care, s/p  C/S 12/24  Stable post-op  Continue routine pp care Active Problems:  Severe preeclampsia - delivered, S/P Mag Sulfate x 36 hrs with good diuresis, improving platelets  BP elevated on labetalol, per Dr. Juliene Pina changed to Procardia 30 XL and HCTZ Anemia - severe, symptomatic  - transfusion 2 units RBC to start ASAP Dysuria and low grade temp  Suspect UTI, will send cath UCX and start prophylactic abx - Macrobid 100 mg PO BID pending UCX results Labile mood, at increased risk for PPD  -discussed R/B of SSRI, patient desires to start Zoloft  -spouse supportive, good extended family support.  Consult w/ Dr. Juliene Pina, MD will see patient later today.   Maleta Pacha 12/31/2010, 12:21 PM

## 2010-12-31 NOTE — Progress Notes (Signed)
Patient ID: [name], female   DOB: Aug 14, 1980, 30 y.o.   MRN: [id number] Severe anemia, not coping well, tired, dizzy. Recommend transfusion 2 units agrees.  BP still high, change to Procardia 30XL and Hydrochlorothiazide.  PP depression reviewed s/s. Situational anxiety. 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

## 2011-01-01 DIAGNOSIS — D649 Anemia, unspecified: Secondary | ICD-10-CM

## 2011-01-01 LAB — CBC
HCT: 30.5 % — ABNORMAL LOW (ref 36.0–46.0)
Hemoglobin: 10.1 g/dL — ABNORMAL LOW (ref 12.0–15.0)
MCV: 85 fL (ref 78.0–100.0)
RDW: 15.7 % — ABNORMAL HIGH (ref 11.5–15.5)
WBC: 9.7 10*3/uL (ref 4.0–10.5)

## 2011-01-01 LAB — TYPE AND SCREEN
ABO/RH(D): B POS
Antibody Screen: NEGATIVE
Unit division: 0
Unit division: 0

## 2011-01-01 LAB — URINE CULTURE: Culture: NO GROWTH

## 2011-01-01 MED ORDER — SERTRALINE HCL 50 MG PO TABS: 50.0000 mg | ORAL_TABLET | Freq: Every day | ORAL | Status: DC

## 2011-01-01 MED ORDER — OXYCODONE HCL 10 MG PO TABS: 10.0000 mg | ORAL_TABLET | ORAL | Status: AC | PRN

## 2011-01-01 MED ORDER — HYDROCHLOROTHIAZIDE 12.5 MG PO CAPS
25.0000 mg | ORAL_CAPSULE | Freq: Every day | ORAL | Status: DC
Start: 2011-01-01 — End: 2011-01-01
  Administered 2011-01-01: 12.5 mg via ORAL
  Filled 2011-01-01 (×2): qty 2

## 2011-01-01 MED ORDER — NIFEDIPINE ER 60 MG PO TB24
60.0000 mg | ORAL_TABLET | Freq: Every day | ORAL | Status: DC
  Administered 2011-01-01: 60 mg via ORAL
  Filled 2011-01-01 (×2): qty 1

## 2011-01-01 MED ORDER — FERROUS SULFATE 325 (65 FE) MG PO TABS: 325.0000 mg | ORAL_TABLET | Freq: Two times a day (BID) | ORAL | Status: DC

## 2011-01-01 MED ORDER — HYDROCHLOROTHIAZIDE 12.5 MG PO CAPS: 25.0000 mg | ORAL_CAPSULE | Freq: Every day | ORAL | Status: DC

## 2011-01-01 MED ORDER — NITROFURANTOIN MONOHYD MACRO 100 MG PO CAPS: 100.0000 mg | ORAL_CAPSULE | Freq: Two times a day (BID) | ORAL | Status: AC

## 2011-01-01 MED ORDER — NIFEDIPINE ER 60 MG PO TB24: 60.0000 mg | ORAL_TABLET | Freq: Every day | ORAL | Status: DC

## 2011-01-01 NOTE — Discharge Summary (Signed)
Physician Discharge Summary  Patient ID: [name] MRN: [id number] DOB/AGE: 02/22/1980 30 y.o.  Admit date: 12/25/2010  Discharge date: 01/01/2011  Admission Diagnoses: 34.1/7 wks, severe Preeclampsia, Di-Di twins Discharge Diagnoses:  Severe Preeclampsia, BP controlled on 2 antiHTN meds                                         Pulmonary edema resolved                                         S/p cesarean delivery of twins on 12/27/10                                         Anemia, s/p 2 pRBCs transfusion, improved  Discharged Condition: Improved, stable.  Hospital Course:   30 yo G2P1001 with recent diagnosis of mild preeclampsia who was admitted at 34.1/7 wks, twins with c/o headache, blurred vision and subjective oliguria. Oliguria was severe (400 cc in 24 hrs) despite bedrest, IV and PO hydration and Labetalol 200 mg tid. Lab values showed slight drop in Platelets, without HELLP diagnosis. NICU and Anesthesia consults obtained on 12/23. Once Oliguria confirmed with 400 cc output on 12/24 morning, decision was made to proceed with urgent c-section for severe preeclampsia. Patient was anemic to start with (Hgb 8 pre-op). Surgery was uncomplicated and twins (Boy and Girl) were sent to NICU, stable. Patient was started on magnesium sulfate (total 40 hrs) and was in ICU until stopped. BPs improved post -op but started to rise again and was switched from Labetalol to Nifedipine and Hydrochlorothiazide. Patient developed Pulmonary edema on POD1 and received lasix with resolution. Diuresis improved after 36 hrs from delivery. Lab values were followed serially and when Hgbn was down to 6.4 and as patient was symptomatic, she was given 2 units pRBCs on 12/31/10 without complications and Hgbn improved to 10. Post op pain well controlled, staples will be removed before discharge.  Discharge instructions for BP management, post op care, iron and increased calcium use, breast feeding help with pumping etc  reviewed.   Consults: NICU and Anesthesia on 12/26/10  Discharge Exam: Blood pressure 143/96, pulse 83, temperature 98.4 F (36.9 C), temperature source Oral, resp. rate 18, height 5\' 2"  (1.575 m), weight 179 lb (81.194 kg), last menstrual period 04/05/2010, SpO2 96.00%, unknown if currently breastfeeding. Improved exam, affect better, incision healing well,  BP improved but will continue to need antiHTN and doses increased today. Feels well since 2 units packed RBC transfusion.   Disposition: Home or Self Care  Discharge Orders    Future Orders Please Complete By Expires   Diet - low sodium heart healthy      Increase activity slowly      Discharge instructions      Comments:   BP check in office in 4-5 days, call and schedule.   Driving Restrictions      Comments:   2 wks   Lifting restrictions      Comments:   6 wks   Sexual Activity Restrictions      Comments:   6 wks   No dressing needed      Call MD for:  temperature >100.4      Call MD for:  persistant nausea and vomiting      Call MD for:  severe uncontrolled pain      Call MD for:  persistant dizziness or light-headedness      Call MD for:  hives      Call MD for:  difficulty breathing, headache or visual disturbances      Call MD for:  redness, tenderness, or signs of infection (pain, swelling, redness, odor or green/yellow discharge around incision site)      Call MD for:  extreme fatigue      Remove staples      Comments:   Remove before discharge, apply steristrips.   HIV antibody      Comments:   This external order was created through the Results Console.   Rubella antibody, IgM      Comments:   This external order was created through the Results Console.   Hepatitis B surface antigen      Comments:   This external order was created through the Results Console.   RPR      Comments:   This external order was created through the Results Console.   Antibody screen      Comments:   This external order  was created through the Results Console.   ABO/Rh      Comments:   This external order was created through the Results Console.    Call MD if severe Headache, vision changes, RUQ pain.   Medication List  As of 01/01/2011 10:32 AM   START taking these medications         ferrous sulfate 325 (65 FE) MG tablet   Take 1 tablet (325 mg total) by mouth 2 (two) times daily with a meal.      hydrochlorothiazide 12.5 MG capsule   Commonly known as: MICROZIDE   Take 2 capsules (25 mg total) by mouth daily.      NIFEdipine 60 MG 24 hr tablet   Commonly known as: PROCARDIA-XL/ADALAT CC   Take 1 tablet (60 mg total) by mouth daily.      nitrofurantoin (macrocrystal-monohydrate) 100 MG capsule   Commonly known as: MACROBID   Take 1 capsule (100 mg total) by mouth every 12 (twelve) hours.      Oxycodone HCl 10 MG Tabs   Take 1 tablet (10 mg total) by mouth every 4 (four) hours as needed.      sertraline 50 MG tablet   Commonly known as: ZOLOFT   Take 1 tablet (50 mg total) by mouth daily.         CONTINUE taking these medications         prenatal vitamin w/FE, FA 27-1 MG Tabs      zolpidem 5 MG tablet   Commonly known as: AMBIEN         STOP taking these medications         acetaminophen 500 MG tablet      amoxicillin 875 MG tablet      labetalol 100 MG tablet      neomycin-colistin-hydrocortisone-thonzonium 3.03-05-08-0.5 MG/ML otic suspension          Where to get your medications    These are the prescriptions that you need to pick up.   You may get these medications from any pharmacy.         ferrous sulfate 325 (65 FE) MG tablet   hydrochlorothiazide 12.5 MG capsule  NIFEdipine 60 MG 24 hr tablet   nitrofurantoin (macrocrystal-monohydrate) 100 MG capsule   Oxycodone HCl 10 MG Tabs   sertraline 50 MG tablet           Follow-up Information    Follow up with [facility] in 5 days. (for BP check in office )    Contact information:   [address] [place] [address] [phone]        V.Juliene Pina, MD  Signed: Robley Fries 01/01/2011, 10:32 AM

## 2011-01-01 NOTE — Progress Notes (Signed)
D/c instructions reviewed with pt.  Pt. Stated understanding of same.  Breast pump rented to pt.  No other home equipment needed.  Ambulated to car with staff without incident.  D/c'd home with family.

## 2011-01-01 NOTE — Progress Notes (Signed)
Called to pts. Room.  Pt. States feeling flushed with  increased heart rate.  Pulse 121, Bp 128/81.Dr. Juliene Pina notified of same.  Will continue to monitor.

## 2011-01-01 NOTE — Progress Notes (Signed)
Patient ID: [name], female   DOB: 1980/02/19, 30 y.o.   MRN: [id number] Subjective: Feels much better, no HA/ vision changes/ dizziness/ fatigue/ crying spells.  S/p 2 units pRBCs yesterday. CBC improved this am.  Depression, started on 50 mg Zoloft yesterday BP still elevated, changed to Nifedipine and Hydrochlorothiazide, will increase both.   Objective: Vital signs in last 24 hours: Temp:  [98 F (36.7 C)-99.1 F (37.3 C)] 98.3 F (36.8 C) (12/29 0537) Pulse Rate:  [73-97] 74  (12/29 0537) Resp:  [18-20] 18  (12/29 0537) BP: (124-155)/(74-105) 152/94 mmHg (12/29 0537) SpO2:  [94 %-98 %] 95 % (12/29 0537) Weight:  [179 lb (81.194 kg)] 179 lb (81.194 kg) (12/29 0719) Weight change:  Last BM Date: 12/26/10  Intake/Output from previous day: 12/28 0701 - 12/29 0700 In: 3304.2 [P.O.:2160; Blood:1144.2] Out: 3900 [Urine:3900]  Improved output   Physical exam:  A&O x 3, no acute distress. Pleasant HEENT neg, no thyromegaly Lungs CTA bilat CV RRR, S1S2 normal Abdo soft, non tender, non acute Extr Edema improving. No calf tenderness. DTR +2/+2  Lab Results: Basename 01/01/11 0505 12/30/10 0545  WBC 9.7 8.0  HGB 10.1* 6.4*  HCT 30.5* 20.3*  PLT 174 136*  S/p 2 packed RBCs transfusion.   Assessment/Plan:  LOS: 7 days  1) Severe PEC - increased meds today to see if BP better, assess today and consider discharge once improved. F/up office BP check in 4-5 days. Diuresing well. Pulmonary edema resolved.  2) Severe Anemia - improved s/p 2 units pRBCs, continue iron tid 3) PP Depression - started Zoloft, difficult to console, cries a lot thinking about babies in NICU, good support from husband, not suicidal 4) Post op - stable, remove staples today before discharge.   Post op care, PEC warning signs, diet, activity, BP meds, iron etc reviewed. Understands and agrees.   [name] R 01/01/2011, 9:09 AM

## 2011-01-31 ENCOUNTER — Encounter (HOSPITAL_COMMUNITY)
Admission: RE | Admit: 2011-01-31 | Discharge: 2011-01-31 | Disposition: A | Discharge status: Home / Self Care | Payer: 59 | Source: Ambulatory Visit | Attending: Obstetrics & Gynecology | Admitting: Obstetrics & Gynecology

## 2011-01-31 DIAGNOSIS — O923 Agalactia: Secondary | ICD-10-CM | POA: Insufficient documentation

## 2011-03-03 ENCOUNTER — Encounter (HOSPITAL_COMMUNITY)
Admission: RE | Admit: 2011-03-03 | Discharge: 2011-03-03 | Disposition: A | Discharge status: Home / Self Care | Payer: 59 | Source: Ambulatory Visit | Attending: Obstetrics & Gynecology | Admitting: Obstetrics & Gynecology

## 2011-03-03 DIAGNOSIS — O923 Agalactia: Secondary | ICD-10-CM | POA: Insufficient documentation

## 2011-04-02 ENCOUNTER — Encounter (HOSPITAL_COMMUNITY)
Admission: RE | Admit: 2011-04-02 | Discharge: 2011-04-02 | Disposition: A | Discharge status: Home / Self Care | Payer: Self-pay | Source: Ambulatory Visit | Attending: Obstetrics & Gynecology | Admitting: Obstetrics & Gynecology

## 2011-04-02 DIAGNOSIS — O923 Agalactia: Secondary | ICD-10-CM | POA: Insufficient documentation

## 2011-05-03 ENCOUNTER — Encounter (HOSPITAL_COMMUNITY)
Admission: RE | Admit: 2011-05-03 | Discharge: 2011-05-03 | Disposition: A | Discharge status: Home / Self Care | Payer: Self-pay | Source: Ambulatory Visit | Attending: Obstetrics & Gynecology | Admitting: Obstetrics & Gynecology

## 2011-05-03 DIAGNOSIS — O923 Agalactia: Secondary | ICD-10-CM | POA: Insufficient documentation

## 2012-04-17 IMAGING — US US OB COMP +14 WK
1 series · 14 of 27 positions shown · non-contrast
Comparison: none

[Series 1: us ob comp +14 wk · 14 of 27 slices shown]
[im 1/27]
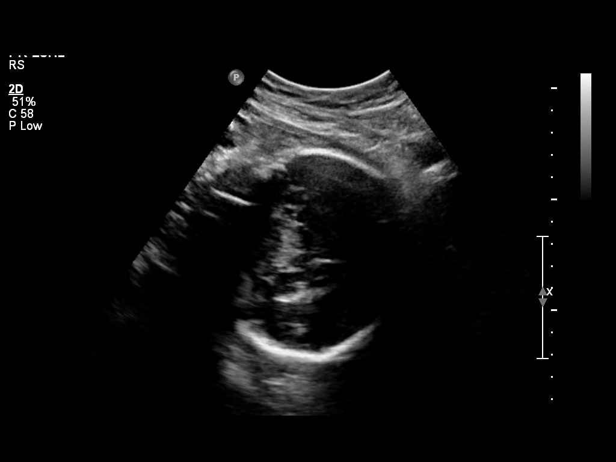
[im 3/27]
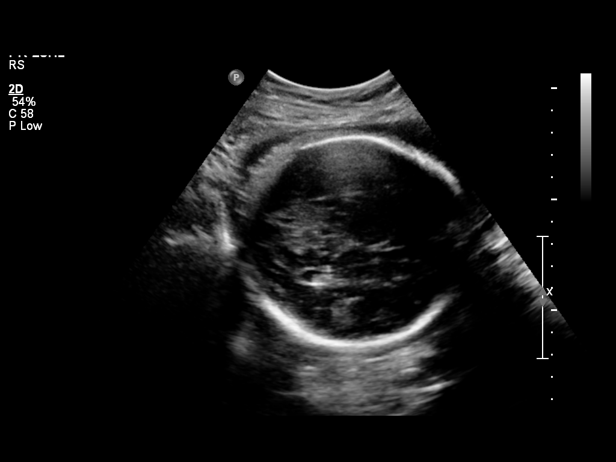
[im 5/27]
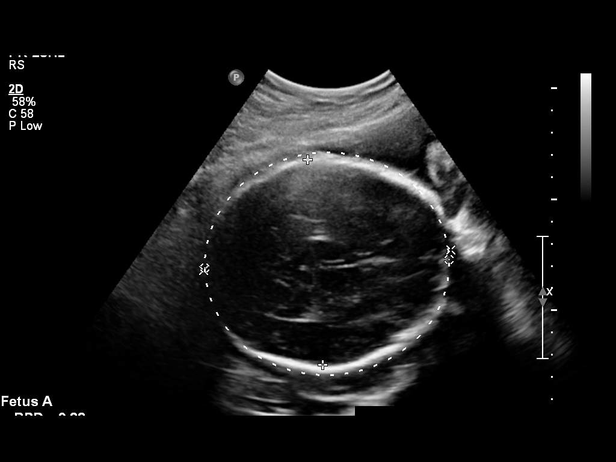
[im 7/27]
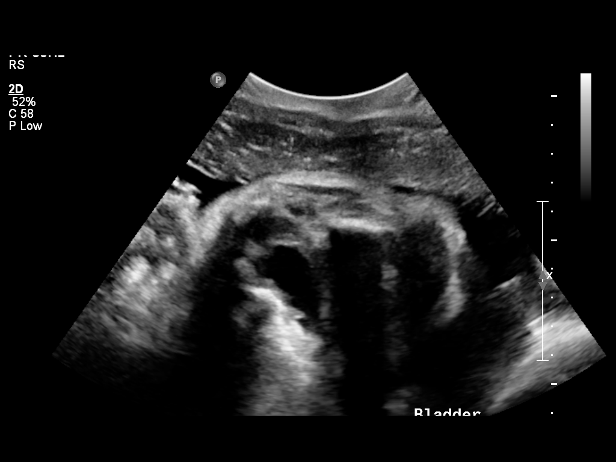
[im 9/27]
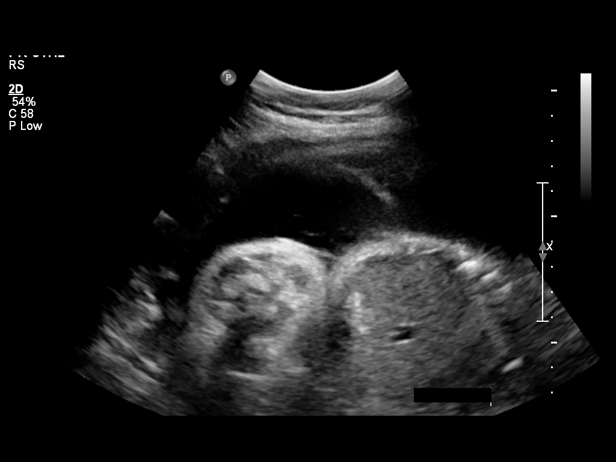
[im 11/27]
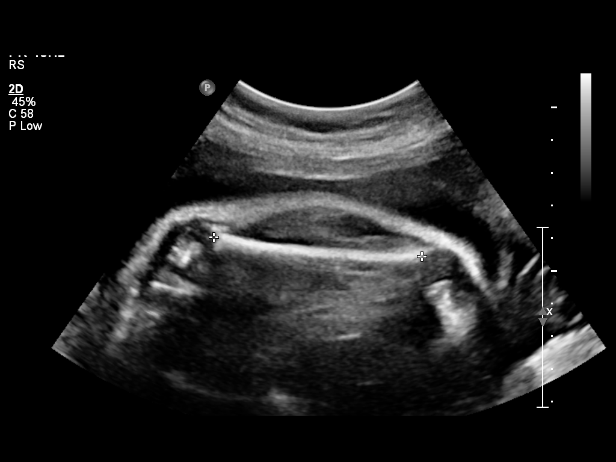
[im 13/27]
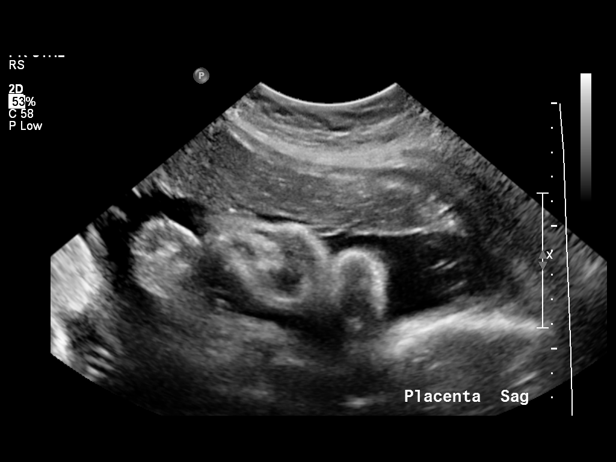
[im 15/27]
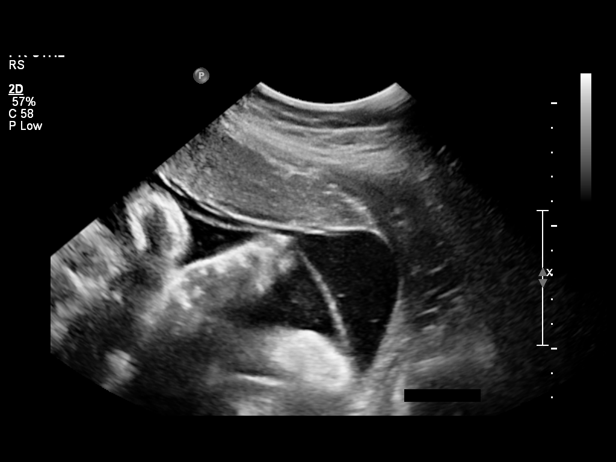
[im 17/27]
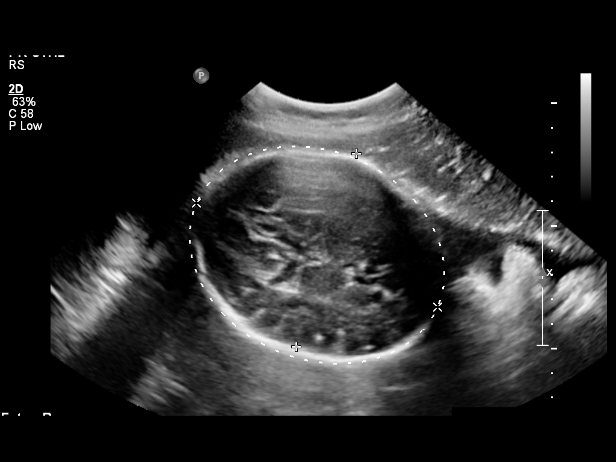
[im 19/27]
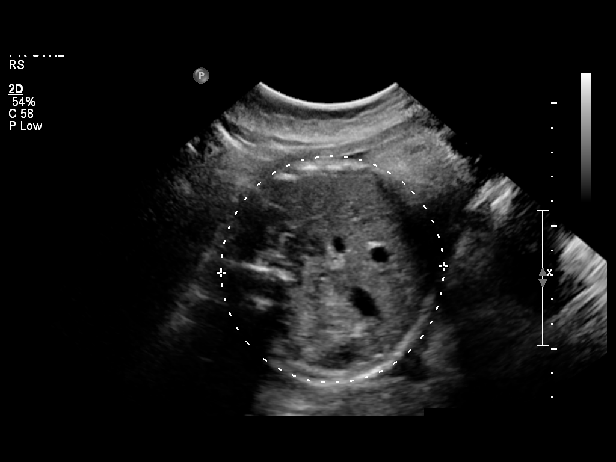
[im 21/27]
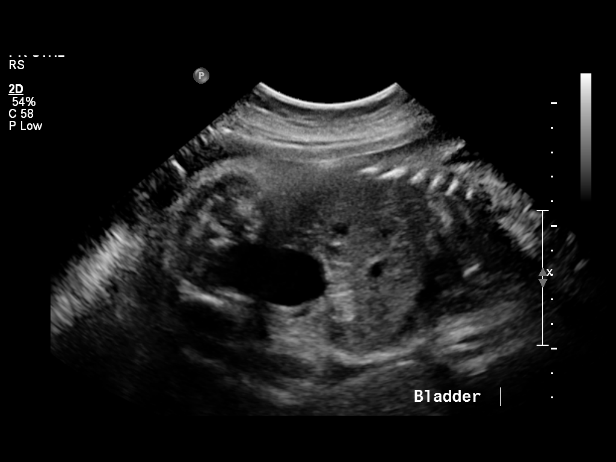
[im 23/27]
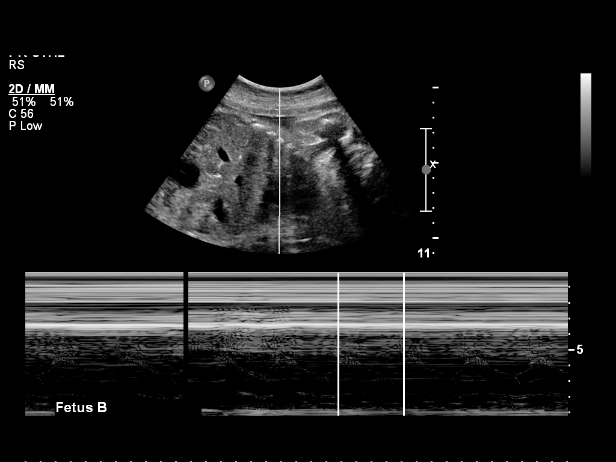
[im 25/27]
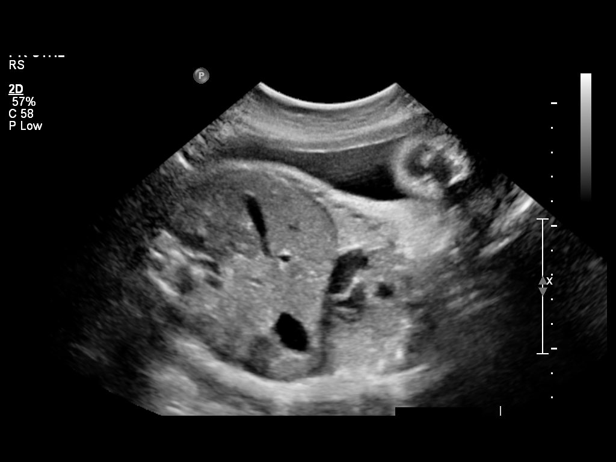
[im 27/27]
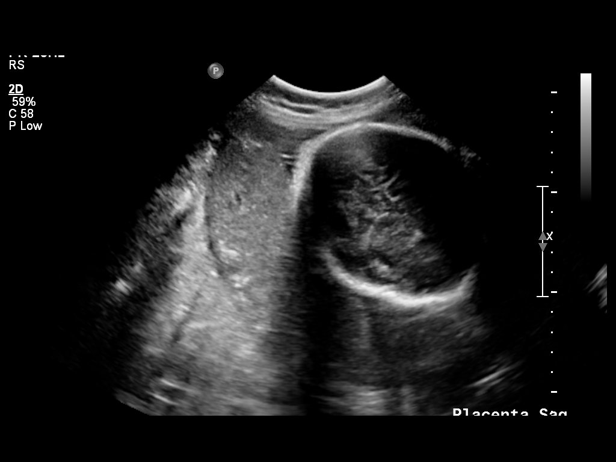

[14 of 27 positions shown; findings below may reference images not displayed]

OBSTETRICS REPORT
                      (Signed Final 12/27/2010 [DATE])

 Order#:         [id number]
                 2_I
Procedures

 US OB COMP ADD'L GEST + 14 WK                         76810.1
 US OB COMP + 14 WK                                    76805.1
Indications

 Twin gestation, Di-Di
 Assess Fetal Growth / Estimated Fetal Weight
 Pre-eclampsia
Fetal Evaluation (Fetus A)

 Fetal Heart Rate:  122                         bpm
 Cardiac Activity:  Observed
 Fetal Lie:         Maternal right side
 Presentation:      Cephalic
 Placenta:          Anterior, above cervical os

 Membrane Desc:     Dividing
                    Membrane seen

 Amniotic Fluid
 AFI FV:      Subjectively within normal limits
                                             Larg Pckt:     5.6  cm
Biometry (Fetus A)

 BPD:     91.9  mm    G. Age:   37w 2d                CI:         82.6   70 - 86
 OFD:    111.3  mm                                    FL/HC:      19.3   19.4 -

 HC:     329.8  mm    G. Age:   37w 4d       89  %    HC/AC:      1.08   0.96 -

 AC:       305  mm    G. Age:   34w 4d       59  %    FL/BPD:     69.3   71 - 87
 FL:      63.7  mm    G. Age:   33w 0d       12  %    FL/AC:      20.9   20 - 24

 Est. FW:    0535  gm      5 lb 7 oz     66  %     FW Discordancy     0 \ 17 %
Gestational Age (Fetus A)

 LMP:           34w 0d       Date:   05/02/10                 EDD:   02/06/11
 Clinical EDD:  34w 2d                                        EDD:   02/04/11
 U/S Today:     35w 4d                                        EDD:   01/26/11
 Best:          34w 2d    Det. By:   Clinical EDD             EDD:   02/04/11
Anatomy (Fetus A)

 Cranium:           Appears normal      Aortic Arch:       Not well
                                                           visualized
 Fetal Cavum:       Appears normal      Ductal Arch:       Not well
                                                           visualized
 Ventricles:        Appears normal      Diaphragm:         Not well
                                                           visualized
 Choroid Plexus:    Not well            Stomach:           Appears
                    visualized                             normal, left
                                                           sided
 Cerebellum:        Not well            Abdomen:           Appears normal
                    visualized
 Posterior Fossa:   Not well            Abdominal Wall:    Not well
                    visualized                             visualized
 Nuchal Fold:       Not applicable      Cord Vessels:      Not well
                    (>20 wks GA)                           visualized
 Face:              Not well            Kidneys:           Appear normal
                    visualized
 Heart:             Not well            Bladder:           Appears normal
                    visualized
 RVOT:              Not well            Spine:             Not well
                    visualized                             visualized
 LVOT:              Not well            Limbs:             Not well
                    visualized                             visualized

 Other:     Technically difficult due to advanced GA and fetal
            position.

Fetal Evaluation (Fetus B)

 Fetal Heart Rate:  138                         bpm
 Cardiac Activity:  Observed
 Fetal Lie:         Maternal left side
 Presentation:      Transverse, head to
                    maternal left
 Placenta:          Posterior, above cervical
                    os

 Membrane Desc:     Dividing
                    Membrane seen

 Amniotic Fluid
 AFI FV:      Subjectively within normal limits
                                             Larg Pckt:     5.5  cm
Biometry (Fetus B)

 BPD:     82.4  mm    G. Age:   33w 1d                CI:         80.1   70 - 86
 OFD:    102.9  mm                                    FL/HC:      21.0   19.4 -

 HC:     300.2  mm    G. Age:   33w 2d        5  %    HC/AC:      1.05   0.96 -

 AC:     287.1  mm    G. Age:   32w 5d       15  %    FL/BPD:     76.5   71 - 87
 FL:        63  mm    G. Age:   32w 4d        8  %    FL/AC:      21.9   20 - 24

 Est. FW:    3472  gm      4 lb 8 oz     31  %     FW Discordancy        17  %
Gestational Age (Fetus B)
 LMP:           34w 0d       Date:   05/02/10                 EDD:   02/06/11
 Clinical EDD:  34w 2d                                        EDD:   02/04/11
 U/S Today:     32w 6d                                        EDD:   02/14/11
 Best:          34w 2d    Det. By:   Clinical EDD             EDD:   02/04/11
Anatomy (Fetus B)

 Cranium:           Appears normal      Aortic Arch:       Not well
                                                           visualized
 Fetal Cavum:       Not well            Ductal Arch:       Not well
                    visualized                             visualized
 Ventricles:        Not well            Diaphragm:         Appears normal
                    visualized
 Choroid Plexus:    Not well            Stomach:           Appears
                    visualized                             normal, left
                                                           sided
 Cerebellum:        Not well            Abdomen:           Appears normal
                    visualized
 Posterior Fossa:   Not well            Abdominal Wall:    Not well
                    visualized                             visualized
 Nuchal Fold:       Not applicable      Cord Vessels:      Not well
                    (>20 wks GA)                           visualized
 Face:              Not well            Kidneys:           Appear normal
                    visualized
 Heart:             Not well            Bladder:           Appears normal
                    visualized
 RVOT:              Not well            Spine:             Not well
                    visualized                             visualized
 LVOT:              Not well            Limbs:             Not well
                    visualized                             visualized

 Other:     Technically difficult due to advanced GA and fetal
            position.
Cervix Uterus Adnexa

 Cervix:       Not visualized (advanced GA >34 wks)
Impression

 Living twin IUP, with assigned GA currently 34w 2d.  Fetus B
 measuring smaller than A, with EFW discrepancy of 17%.
 Amniotic fluid volumes within normal limits for both twins.
Recommendations

 Continued US follow-up of fetal growth in 3 weeks.

 questions or concerns.

## 2012-11-15 ENCOUNTER — Telehealth: Payer: Self-pay | Admitting: Genetic Counselor

## 2012-11-15 NOTE — Telephone Encounter (Signed)
S/W PT AND [facility] APPT 02/16 @ 1:30 W/KAREN POWELL. WELCOME PACKET MAILED.

## 2013-02-18 ENCOUNTER — Other Ambulatory Visit: Payer: Self-pay

## 2013-02-18 ENCOUNTER — Encounter: Payer: Self-pay | Admitting: Genetic Counselor

## 2013-09-18 ENCOUNTER — Encounter (HOSPITAL_COMMUNITY): Payer: Self-pay | Admitting: Emergency Medicine

## 2013-09-18 ENCOUNTER — Emergency Department (HOSPITAL_COMMUNITY)
Admission: EM | Admit: 2013-09-18 | Discharge: 2013-09-18 | Disposition: A | Discharge status: Home / Self Care | Payer: Self-pay | Attending: Emergency Medicine | Admitting: Emergency Medicine

## 2013-09-18 DIAGNOSIS — R1011 Right upper quadrant pain: Secondary | ICD-10-CM | POA: Insufficient documentation

## 2013-09-18 DIAGNOSIS — R110 Nausea: Secondary | ICD-10-CM | POA: Insufficient documentation

## 2013-09-18 DIAGNOSIS — Z3202 Encounter for pregnancy test, result negative: Secondary | ICD-10-CM | POA: Insufficient documentation

## 2013-09-18 DIAGNOSIS — Z9889 Other specified postprocedural states: Secondary | ICD-10-CM | POA: Insufficient documentation

## 2013-09-18 DIAGNOSIS — Z79899 Other long term (current) drug therapy: Secondary | ICD-10-CM | POA: Insufficient documentation

## 2013-09-18 DIAGNOSIS — Z8742 Personal history of other diseases of the female genital tract: Secondary | ICD-10-CM | POA: Insufficient documentation

## 2013-09-18 LAB — URINALYSIS, ROUTINE W REFLEX MICROSCOPIC
Bilirubin Urine: NEGATIVE
Glucose, UA: NEGATIVE mg/dL
Hgb urine dipstick: NEGATIVE
KETONES UR: NEGATIVE mg/dL
LEUKOCYTES UA: NEGATIVE
NITRITE: NEGATIVE
PROTEIN: NEGATIVE mg/dL
Specific Gravity, Urine: 1.02 (ref 1.005–1.030)
UROBILINOGEN UA: 0.2 mg/dL (ref 0.0–1.0)
pH: 7 (ref 5.0–8.0)

## 2013-09-18 LAB — CBC WITH DIFFERENTIAL/PLATELET
BASOS PCT: 0 % (ref 0–1)
Basophils Absolute: 0 10*3/uL (ref 0.0–0.1)
EOS ABS: 0.1 10*3/uL (ref 0.0–0.7)
Eosinophils Relative: 1 % (ref 0–5)
HCT: 39.4 % (ref 36.0–46.0)
Hemoglobin: 13.4 g/dL (ref 12.0–15.0)
Lymphocytes Relative: 35 % (ref 12–46)
Lymphs Abs: 2 10*3/uL (ref 0.7–4.0)
MCH: 30.1 pg (ref 26.0–34.0)
MCHC: 34 g/dL (ref 30.0–36.0)
MCV: 88.5 fL (ref 78.0–100.0)
MONOS PCT: 8 % (ref 3–12)
Monocytes Absolute: 0.4 10*3/uL (ref 0.1–1.0)
NEUTROS PCT: 56 % (ref 43–77)
Neutro Abs: 3.2 10*3/uL (ref 1.7–7.7)
PLATELETS: 235 10*3/uL (ref 150–400)
RBC: 4.45 MIL/uL (ref 3.87–5.11)
RDW: 12.4 % (ref 11.5–15.5)
WBC: 5.7 10*3/uL (ref 4.0–10.5)

## 2013-09-18 LAB — COMPREHENSIVE METABOLIC PANEL
ALBUMIN: 3.9 g/dL (ref 3.5–5.2)
ALK PHOS: 83 U/L (ref 39–117)
ALT: 19 U/L (ref 0–35)
ANION GAP: 12 (ref 5–15)
AST: 16 U/L (ref 0–37)
BUN: 10 mg/dL (ref 6–23)
CO2: 24 mEq/L (ref 19–32)
Calcium: 8.8 mg/dL (ref 8.4–10.5)
Chloride: 104 mEq/L (ref 96–112)
Creatinine, Ser: 0.74 mg/dL (ref 0.50–1.10)
GFR calc Af Amer: >90 mL/min (ref >90)
GFR calc non Af Amer: >90 mL/min (ref >90)
Glucose, Bld: 89 mg/dL (ref 70–99)
POTASSIUM: 3.9 meq/L (ref 3.7–5.3)
SODIUM: 140 meq/L (ref 137–147)
TOTAL PROTEIN: 7.3 g/dL (ref 6.0–8.3)
Total Bilirubin: 0.2 mg/dL — ABNORMAL LOW (ref 0.3–1.2)

## 2013-09-18 LAB — PREGNANCY, URINE: Preg Test, Ur: NEGATIVE

## 2013-09-18 MED ORDER — ONDANSETRON 4 MG PO TBDP: ORAL_TABLET | ORAL | Status: DC

## 2013-09-18 MED ORDER — KETOROLAC TROMETHAMINE 30 MG/ML IJ SOLN
30.0000 mg | Freq: Once | INTRAMUSCULAR | Status: DC
Start: 2013-09-18 — End: 2013-09-18
  Filled 2013-09-18: qty 1

## 2013-09-18 MED ORDER — HYDROCODONE-ACETAMINOPHEN 5-325 MG PO TABS: 1.0000 | ORAL_TABLET | ORAL | Status: DC | PRN

## 2013-09-18 NOTE — ED Provider Notes (Signed)
CSN: [id number]     Arrival date & time 09/18/13  1926 History   First MD Initiated Contact with Patient 09/18/13 2258     Chief Complaint  Patient presents with  . Abdominal Pain     (Consider location/radiation/quality/duration/timing/severity/associated sxs/prior Treatment) HPI Comments: Pt is a 33 y/o female with no significant PMHx who presents to the ED complaining of RUQ abdominal pain x 1 week worsening 1 day ago. Pt states the pain began as a dull ache and became stabbing yesterday. Pain intermittent, worse if she sits up, relieved by laying flat, radiating toward the right side of her back. She also reports increased pain after eating with associated nausea. No vomiting. Denies fever or chills, chest pain, sob. Denies increased urinary frequency, urgency, or dysuria. Her menstrual cycle began today and is normal. Hx of c-sections, otherwise no abdominal surgeries. Denies hx of gallstones.  The history is provided by the patient.    Past Medical History  Diagnosis Date  . Infertility associated with anovulation   . Headache(784.0)   . PP care, s/p C/S 12/24 12/27/2010   Past Surgical History  Procedure Laterality Date  . Tonsillectomy    . Partial amputation of rt great toe    . Cesarean section  12/27/2010    Procedure: CESAREAN SECTION;  Surgeon: Elveria Royals;  Location: [facility];  Service: Gynecology;  Laterality: N/A;  Primary Cesarian Section with Bilateral Tubal Ligations; Twin Birth   History reviewed. No pertinent family history. History  Substance Use Topics  . Smoking status: Never Smoker   . Smokeless tobacco: Never Used  . Alcohol Use: No   OB History   Grav Para Term Preterm Abortions TAB SAB Ect Mult Living   2 2 1 1  0 0 0 0 1 3     Review of Systems  Gastrointestinal: Positive for nausea and abdominal pain.  Musculoskeletal: Positive for back pain.  All other systems reviewed and are negative.     Allergies  Tramadol  Home Medications    Prior to Admission medications   Medication Sig Start Date End Date Taking? Authorizing Provider  ferrous sulfate 325 (65 FE) MG tablet Take 1 tablet (325 mg total) by mouth 2 (two) times daily with a meal. 01/01/11 01/01/12  Elveria Royals, MD  hydrochlorothiazide (MICROZIDE) 12.5 MG capsule Take 2 capsules (25 mg total) by mouth daily. 01/01/11 01/01/12  Elveria Royals, MD  HYDROcodone-acetaminophen (NORCO/VICODIN) 5-325 MG per tablet Take 1-2 tablets by mouth every 4 (four) hours as needed. 09/18/13   Illene Labrador, PA-C  NIFEdipine (PROCARDIA-XL/ADALAT CC) 60 MG 24 hr tablet Take 1 tablet (60 mg total) by mouth daily. 01/01/11 01/01/12  Elveria Royals, MD  ondansetron (ZOFRAN ODT) 4 MG disintegrating tablet 4mg  ODT q4 hours prn nausea/vomit 09/18/13   Illene Labrador, PA-C  prenatal vitamin w/FE, FA (PRENATAL 1 + 1) 27-1 MG TABS Take 1 tablet by mouth daily.      Historical Provider, MD  sertraline (ZOLOFT) 50 MG tablet Take 1 tablet (50 mg total) by mouth daily. 01/01/11 01/01/12  Elveria Royals, MD  zolpidem (AMBIEN) 5 MG tablet Take 5 mg by mouth at bedtime as needed. sleep     Historical Provider, MD   BP 134/93  Pulse 74  Temp(Src) 98.7 F (37.1 C) (Oral)  Resp 14  Ht 5\' 2"  (1.575 m)  Wt 185 lb (83.915 kg)  BMI 33.83 kg/m2  SpO2 100%  LMP 09/18/2013 Physical Exam  Constitutional: She is oriented to person, place, and time. She appears well-developed and well-nourished. No distress.  HENT:  Head: Normocephalic and atraumatic.  Mouth/Throat: Oropharynx is clear and moist.  Eyes: Conjunctivae are normal. No scleral icterus.  Neck: Normal range of motion. Neck supple.  Cardiovascular: Normal rate, regular rhythm and normal heart sounds.   Pulmonary/Chest: Effort normal and breath sounds normal.  Abdominal: Soft. Normal appearance and bowel sounds are normal. She exhibits no distension. There is tenderness in the right upper quadrant. There is no rigidity, no rebound, no  guarding, no CVA tenderness and negative Murphy's sign.  No peritoneal signs.  Musculoskeletal: Normal range of motion. She exhibits no edema.  Neurological: She is alert and oriented to person, place, and time.  Skin: Skin is warm and dry. She is not diaphoretic.  Psychiatric: She has a normal mood and affect. Her behavior is normal.    ED Course  Procedures (including critical care time) Labs Review Labs Reviewed  COMPREHENSIVE METABOLIC PANEL - Abnormal; Notable for the following:    Total Bilirubin 0.2 (*)    All other components within normal limits  URINALYSIS, ROUTINE W REFLEX MICROSCOPIC  PREGNANCY, URINE  CBC WITH DIFFERENTIAL    Imaging Review No results found.   EKG Interpretation None      MDM   Final diagnoses:  Right upper quadrant abdominal pain   Pt presenting with RUQ abdominal pain, worse with eating, associated nausea. She is well appearing and in NAD. AFVSS. Tenderness in RUQ without peritoneal signs. No CVA tenderness. Negative Murphy's sign. Labs obtained prior to patient being seen, no acute findings. No leukocytosis. Liver functions and alkaline phosphatase within normal limits. Doubt cholecystitis, however possible gallstones. No ultrasound available at this time. I do not feel this is an emergent need for ultrasound. I discussed this with patient who states she has seen a gastroenterologist in the past, and she also has a primary care physician. States she will call both of them to schedule appointments. Will discharge home with pain and nausea medication. Stable for discharge. Return precautions given. Patient states understanding of treatment care plan and is agreeable.   Illene Labrador, PA-C 09/18/13 2357

## 2013-09-18 NOTE — ED Notes (Signed)
Patient verbalizes understanding of discharge instructions, prescription medications, home care and follow up care. Patient ambulatory out of department at this time. 

## 2013-09-18 NOTE — Discharge Instructions (Signed)
Take Vicodin for severe pain only. No driving or operating heavy machinery while taking vicodin. This medication may cause drowsiness. Take zofran as directed as needed for nausea. Follow up with your primary care doctor and gastroenterologist for evaluation of possible gallstones.  Abdominal Pain Many things can cause abdominal pain. Usually, abdominal pain is not caused by a disease and will improve without treatment. It can often be observed and treated at home. Your health care provider will do a physical exam and possibly order blood tests and X-rays to help determine the seriousness of your pain. However, in many cases, more time must pass before a clear cause of the pain can be found. Before that point, your health care provider may not know if you need more testing or further treatment. HOME CARE INSTRUCTIONS  Monitor your abdominal pain for any changes. The following actions may help to alleviate any discomfort you are experiencing:  Only take over-the-counter or prescription medicines as directed by your health care provider.  Do not take laxatives unless directed to do so by your health care provider.  Try a clear liquid diet (broth, tea, or water) as directed by your health care provider. Slowly move to a bland diet as tolerated. SEEK MEDICAL CARE IF:  You have unexplained abdominal pain.  You have abdominal pain associated with nausea or diarrhea.  You have pain when you urinate or have a bowel movement.  You experience abdominal pain that wakes you in the night.  You have abdominal pain that is worsened or improved by eating food.  You have abdominal pain that is worsened with eating fatty foods.  You have a fever. SEEK IMMEDIATE MEDICAL CARE IF:   Your pain does not go away within 2 hours.  You keep throwing up (vomiting).  Your pain is felt only in portions of the abdomen, such as the right side or the left lower portion of the abdomen.  You pass bloody or black  tarry stools. MAKE SURE YOU:  Understand these instructions.   Will watch your condition.   Will get help right away if you are not doing well or get worse.  Document Released: 09/29/2004 Document Revised: 12/25/2012 Document Reviewed: 08/29/2012 [facility] Patient Information 2015 [place], [place]. This information is not intended to replace advice given to you by your health care provider. Make sure you discuss any questions you have with your health care provider. Cholelithiasis Cholelithiasis (also called gallstones) is a form of gallbladder disease in which gallstones form in your gallbladder. The gallbladder is an organ that stores bile made in the liver, which helps digest fats. Gallstones begin as small crystals and slowly grow into stones. Gallstone pain occurs when the gallbladder spasms and a gallstone is blocking the duct. Pain can also occur when a stone passes out of the duct.  RISK FACTORS  Being female.   Having multiple pregnancies. Health care providers sometimes advise removing diseased gallbladders before future pregnancies.   Being obese.  Eating a diet heavy in fried foods and fat.   Being older than 68 years and increasing age.   Prolonged use of medicines containing female hormones.   Having diabetes mellitus.   Rapidly losing weight.   Having a family history of gallstones (heredity).  SYMPTOMS  Nausea.   Vomiting.  Abdominal pain.   Yellowing of the skin (jaundice).   Sudden pain. It may persist from several minutes to several hours.  Fever.   Tenderness to the touch. In some cases, when gallstones  do not move into the bile duct, people have no pain or symptoms. These are called "silent" gallstones.  TREATMENT Silent gallstones do not need treatment. In severe cases, emergency surgery may be required. Options for treatment include:  Surgery to remove the gallbladder. This is the most common treatment.  Medicines. These do not  always work and may take 6-12 months or more to work.  Shock wave treatment (extracorporeal biliary lithotripsy). In this treatment an ultrasound machine sends shock waves to the gallbladder to break gallstones into smaller pieces that can pass into the intestines or be dissolved by medicine. HOME CARE INSTRUCTIONS   Only take over-the-counter or prescription medicines for pain, discomfort, or fever as directed by your health care provider.   Follow a low-fat diet until seen again by your health care provider. Fat causes the gallbladder to contract, which can result in pain.   Follow up with your health care provider as directed. Attacks are almost always recurrent and surgery is usually required for permanent treatment.  SEEK IMMEDIATE MEDICAL CARE IF:   Your pain increases and is not controlled by medicines.   You have a fever or persistent symptoms for more than 2-3 days.   You have a fever and your symptoms suddenly get worse.   You have persistent nausea and vomiting.  MAKE SURE YOU:   Understand these instructions.  Will watch your condition.  Will get help right away if you are not doing well or get worse. Document Released: 12/16/2004 Document Revised: 08/22/2012 Document Reviewed: 06/13/2012 [facility] Patient Information 2015 [place], [place]. This information is not intended to replace advice given to you by your health care provider. Make sure you discuss any questions you have with your health care provider.

## 2013-09-18 NOTE — ED Notes (Signed)
Pt c/o upper abd pain x one week, but stabbing pain started yesterday.

## 2013-09-18 NOTE — ED Notes (Signed)
Patient states pain to RUQ that is worse when she eats. Patient states she has relief of pain when she is lying flat.

## 2013-09-21 NOTE — ED Provider Notes (Signed)
Medical screening examination/treatment/procedure(s) were performed by non-physician practitioner and as supervising physician I was immediately available for consultation/collaboration.   EKG Interpretation None        Francine Graven, DO 09/21/13 1727

## 2013-11-04 ENCOUNTER — Encounter (HOSPITAL_COMMUNITY): Payer: Self-pay | Admitting: Emergency Medicine

## 2014-08-02 ENCOUNTER — Encounter (HOSPITAL_COMMUNITY): Payer: Self-pay | Admitting: *Deleted

## 2014-08-02 ENCOUNTER — Emergency Department (HOSPITAL_COMMUNITY)
Admission: EM | Admit: 2014-08-02 | Discharge: 2014-08-03 | Disposition: A | Discharge status: Home / Self Care | Payer: BLUE CROSS/BLUE SHIELD | Attending: Emergency Medicine | Admitting: Emergency Medicine

## 2014-08-02 DIAGNOSIS — N189 Chronic kidney disease, unspecified: Secondary | ICD-10-CM | POA: Insufficient documentation

## 2014-08-02 DIAGNOSIS — Z8742 Personal history of other diseases of the female genital tract: Secondary | ICD-10-CM | POA: Insufficient documentation

## 2014-08-02 DIAGNOSIS — R0602 Shortness of breath: Secondary | ICD-10-CM | POA: Insufficient documentation

## 2014-08-02 DIAGNOSIS — Z8679 Personal history of other diseases of the circulatory system: Secondary | ICD-10-CM | POA: Diagnosis not present

## 2014-08-02 DIAGNOSIS — I129 Hypertensive chronic kidney disease with stage 1 through stage 4 chronic kidney disease, or unspecified chronic kidney disease: Secondary | ICD-10-CM | POA: Diagnosis not present

## 2014-08-02 DIAGNOSIS — Z79899 Other long term (current) drug therapy: Secondary | ICD-10-CM | POA: Diagnosis not present

## 2014-08-02 DIAGNOSIS — I493 Ventricular premature depolarization: Secondary | ICD-10-CM | POA: Diagnosis not present

## 2014-08-02 DIAGNOSIS — R079 Chest pain, unspecified: Secondary | ICD-10-CM | POA: Diagnosis present

## 2014-08-02 LAB — CBC WITH DIFFERENTIAL/PLATELET
Basophils Absolute: 0 10*3/uL (ref 0.0–0.1)
Basophils Relative: 0 % (ref 0–1)
EOS ABS: 0 10*3/uL (ref 0.0–0.7)
Eosinophils Relative: 1 % (ref 0–5)
HCT: 35.9 % — ABNORMAL LOW (ref 36.0–46.0)
HEMOGLOBIN: 11.8 g/dL — AB (ref 12.0–15.0)
Lymphocytes Relative: 33 % (ref 12–46)
Lymphs Abs: 2.5 10*3/uL (ref 0.7–4.0)
MCH: 30 pg (ref 26.0–34.0)
MCHC: 32.9 g/dL (ref 30.0–36.0)
MCV: 91.3 fL (ref 78.0–100.0)
Monocytes Absolute: 0.6 10*3/uL (ref 0.1–1.0)
Monocytes Relative: 7 % (ref 3–12)
NEUTROS PCT: 59 % (ref 43–77)
Neutro Abs: 4.3 10*3/uL (ref 1.7–7.7)
Platelets: 214 10*3/uL (ref 150–400)
RBC: 3.93 MIL/uL (ref 3.87–5.11)
RDW: 12.5 % (ref 11.5–15.5)
WBC: 7.4 10*3/uL (ref 4.0–10.5)

## 2014-08-02 NOTE — ED Provider Notes (Signed)
CSN: [id number]     Arrival date & time 08/02/14  2251 History  This chart was scribed for Mary Fuel, MD by Rayna Sexton, ED scribe. This patient was seen in room APA03/APA03 and the patient's care was started at 11:10 PM.  Chief Complaint  Patient presents with  . Chest Pain   The history is provided by the patient. No language interpreter was used.    HPI Comments: [name] is a 34 y.o. female who presents to the Emergency Department complaining of CP with onset 3 weeks ago and a recent worsening of her symptoms. Pt describes her CP as a heavyness "like her heart is stopping", rates it as a 4/10 and further notes associated SOB when the symptoms occur. She also notes an associated HA but further notes a history of migraines. She denies any hx of smoking but notes a recent heightened blood pressure and further notes her PCP is not concerned about her current BP levels. Pt notes that s/p giving birth to twins in 2012 she had a high BP which nearly caused kidney failure and resulted in her being on BP medications for a period of time. She notes taking Imitrex as need for her chronic migraines and denies taking any other current medications. Pt denies nausea, vomiting and diaphoresis.   Past Medical History  Diagnosis Date  . Infertility associated with anovulation   . Headache(784.0)   . PP care, s/p C/S 12/24 12/27/2010   Past Surgical History  Procedure Laterality Date  . Tonsillectomy    . Partial amputation of rt great toe    . Cesarean section  12/27/2010    Procedure: CESAREAN SECTION;  Surgeon: Elveria Royals;  Location: [facility];  Service: Gynecology;  Laterality: N/A;  Primary Cesarian Section with Bilateral Tubal Ligations; Twin Birth   No family history on file. History  Substance Use Topics  . Smoking status: Never Smoker   . Smokeless tobacco: Never Used  . Alcohol Use: No   OB History    Gravida Para Term Preterm AB TAB SAB Ectopic Multiple Living   2 2 1 1  0 0  0 0 1 3     Review of Systems  Constitutional: Negative for diaphoresis.  Respiratory: Positive for chest tightness and shortness of breath.   Cardiovascular: Positive for chest pain.  Gastrointestinal: Negative for nausea and vomiting.  All other systems reviewed and are negative.   Allergies  Tramadol  Home Medications   Prior to Admission medications   Medication Sig Start Date End Date Taking? Authorizing Provider  SUMAtriptan (IMITREX) 25 MG tablet Take 25 mg by mouth as needed for migraine. May repeat in 2 hours if headache persists or recurs.   Yes Historical Provider, MD  ferrous sulfate 325 (65 FE) MG tablet Take 1 tablet (325 mg total) by mouth 2 (two) times daily with a meal. 01/01/11 01/01/12  Azucena Fallen, MD  hydrochlorothiazide (MICROZIDE) 12.5 MG capsule Take 2 capsules (25 mg total) by mouth daily. 01/01/11 01/01/12  Azucena Fallen, MD  HYDROcodone-acetaminophen (NORCO/VICODIN) 5-325 MG per tablet Take 1-2 tablets by mouth every 4 (four) hours as needed. 09/18/13   Robyn M Hess, PA-C  NIFEdipine (PROCARDIA-XL/ADALAT CC) 60 MG 24 hr tablet Take 1 tablet (60 mg total) by mouth daily. 01/01/11 01/01/12  Azucena Fallen, MD  ondansetron (ZOFRAN ODT) 4 MG disintegrating tablet 4mg  ODT q4 hours prn nausea/vomit 09/18/13   Carman Ching, PA-C  prenatal vitamin w/FE, FA (PRENATAL 1 +  1) 27-1 MG TABS Take 1 tablet by mouth daily.      Historical Provider, MD  sertraline (ZOLOFT) 50 MG tablet Take 1 tablet (50 mg total) by mouth daily. 01/01/11 01/01/12  Azucena Fallen, MD  zolpidem (AMBIEN) 5 MG tablet Take 5 mg by mouth at bedtime as needed. sleep     Historical Provider, MD   Pulse 77  Temp(Src) 97.6 F (36.4 C) (Oral)  Resp 16  Ht 5\' 2"  (1.575 m)  Wt 178 lb (80.74 kg)  BMI 32.55 kg/m2  SpO2 97%  LMP 07/20/2014 Physical Exam  Constitutional: She is oriented to person, place, and time. She appears well-developed and well-nourished.  HENT:  Head: Normocephalic and  atraumatic.  Eyes: EOM are normal. Pupils are equal, round, and reactive to light.  Neck: Normal range of motion. Neck supple. No JVD present.  Cardiovascular: Normal rate and normal heart sounds.   No murmur heard. Frequent premature heart beats  Pulmonary/Chest: Effort normal and breath sounds normal. She has no wheezes. She has no rales. She exhibits no tenderness.  Abdominal: Soft. Bowel sounds are normal. She exhibits no distension and no mass. There is no tenderness.  Musculoskeletal: Normal range of motion. She exhibits no edema.  Lymphadenopathy:    She has no cervical adenopathy.  Neurological: She is alert and oriented to person, place, and time. No cranial nerve deficit. She exhibits normal muscle tone. Coordination normal.  Skin: Skin is warm and dry. No rash noted.  Psychiatric: She has a normal mood and affect. Her behavior is normal. Judgment and thought content normal.    ED Course  Procedures  DIAGNOSTIC STUDIES: Oxygen Saturation is 97% on RA, normal by my interpretation.    COORDINATION OF CARE: 11:17 PM Discussed treatment plan with pt at bedside and pt agreed to plan.  Labs Review Results for orders placed or performed during the hospital encounter of 08/02/14  CBC with Differential  Result Value Ref Range   WBC 7.4 4.0 - 10.5 K/uL   RBC 3.93 3.87 - 5.11 MIL/uL   Hemoglobin 11.8 (L) 12.0 - 15.0 g/dL   HCT 35.9 (L) 36.0 - 46.0 %   MCV 91.3 78.0 - 100.0 fL   MCH 30.0 26.0 - 34.0 pg   MCHC 32.9 30.0 - 36.0 g/dL   RDW 12.5 11.5 - 15.5 %   Platelets 214 150 - 400 K/uL   Neutrophils Relative % 59 43 - 77 %   Neutro Abs 4.3 1.7 - 7.7 K/uL   Lymphocytes Relative 33 12 - 46 %   Lymphs Abs 2.5 0.7 - 4.0 K/uL   Monocytes Relative 7 3 - 12 %   Monocytes Absolute 0.6 0.1 - 1.0 K/uL   Eosinophils Relative 1 0 - 5 %   Eosinophils Absolute 0.0 0.0 - 0.7 K/uL   Basophils Relative 0 0 - 1 %   Basophils Absolute 0.0 0.0 - 0.1 K/uL  Troponin I  Result Value Ref Range    Troponin I <0.03 <0.031 ng/mL  Basic metabolic panel  Result Value Ref Range   Sodium 140 135 - 145 mmol/L   Potassium 3.6 3.5 - 5.1 mmol/L   Chloride 103 101 - 111 mmol/L   CO2 26 22 - 32 mmol/L   Glucose, Bld 105 (H) 65 - 99 mg/dL   BUN 16 6 - 20 mg/dL   Creatinine, Ser 0.94 0.44 - 1.00 mg/dL   Calcium 9.0 8.9 - 10.3 mg/dL   GFR calc non Af  Amer >60 >60 mL/min   GFR calc Af Amer >60 >60 mL/min   Anion gap 11 5 - 15    EKG Interpretation   Date/Time:  Saturday August 02 2014 23:02:57 EDT Ventricular Rate:  88 PR Interval:  151 QRS Duration: 81 QT Interval:  347 QTC Calculation: 420 R Axis:   46 Text Interpretation:  Sinus rhythm Multiple ventricular premature  complexes Low voltage, precordial leads No old tracing to compare  Confirmed by [facility]  MD, Cameron Katayama ([id number]) on 08/02/2014 11:12:05 PM      MDM   Final diagnoses:  Symptomatic PVCs    Symptomatic PVCs. Screening labs will be obtained. It is noted on her history that she is supposed to be taking hydrochlorothiazide. However, she has not actually taking currently.  Potassium is come back technically normal but in the low range at 3.6. Since she is having significant number PVCs, I will give her potassium supplements but the intake at the potassium over 4.0. She is discharged with a prescription for K-Dur, and referred to cardiology for outpatient follow-up.  I personally performed the services described in this documentation, which was scribed in my presence. The recorded information has been reviewed and is accurate.     Mary Fuel, MD 56/15/37 [id number]

## 2014-08-02 NOTE — ED Notes (Signed)
Pt c/o mid center chest pressure that started a week ago, has remained constant, denies any radiation, pt reports that at times she will feel like her heart skips a beat and it takes her breath away,

## 2014-08-03 LAB — BASIC METABOLIC PANEL
Anion gap: 11 (ref 5–15)
BUN: 16 mg/dL (ref 6–20)
CALCIUM: 9 mg/dL (ref 8.9–10.3)
CO2: 26 mmol/L (ref 22–32)
CREATININE: 0.94 mg/dL (ref 0.44–1.00)
Chloride: 103 mmol/L (ref 101–111)
GFR calc non Af Amer: >60 mL/min (ref >60)
Glucose, Bld: 105 mg/dL — ABNORMAL HIGH (ref 65–99)
Potassium: 3.6 mmol/L (ref 3.5–5.1)
Sodium: 140 mmol/L (ref 135–145)

## 2014-08-03 LAB — TROPONIN I: Troponin I: <0.03 ng/mL (ref <0.031)

## 2014-08-03 MED ORDER — POTASSIUM CHLORIDE CRYS ER 20 MEQ PO TBCR
40.0000 meq | EXTENDED_RELEASE_TABLET | Freq: Once | ORAL | Status: AC
  Administered 2014-08-03: 40 meq via ORAL
  Filled 2014-08-03: qty 2

## 2014-08-03 MED ORDER — POTASSIUM CHLORIDE CRYS ER 20 MEQ PO TBCR: 20.0000 meq | EXTENDED_RELEASE_TABLET | Freq: Two times a day (BID) | ORAL | Status: DC

## 2014-08-03 NOTE — Discharge Instructions (Signed)
Premature Ventricular Contraction Premature ventricular contraction (PVC) is an irregularity of the heart rhythm involving extra or skipped heartbeats. In some cases, they may occur without obvious cause or heart disease. Other times, they can be caused by an electrolyte change in the blood. These need to be corrected. They can also be seen when there is not enough oxygen going to the heart. A common cause of this is plaque or cholesterol buildup. This buildup decreases the blood supply to the heart. In addition, extra beats may be caused or aggravated by:  Excessive smoking.  Alcohol consumption.  Caffeine.  Certain medications  Some street drugs. SYMPTOMS   The sensation of feeling your heart skipping a beat (palpitations).  In many cases, the person may have no symptoms. SIGNS AND TESTS   A physical examination may show an occasional irregularity, but if the PVC beats do not happen often, they may not be found on physical exam.  Blood pressure is usually normal.  Other tests that may find extra beats of the heart are:  An EKG (electrocardiogram)  A Holter monitor which can monitor your heart over longer periods of time  An Angiogram (study of the heart arteries). TREATMENT  Usually extra heartbeats do not need treatment. The condition is treated only if symptoms are severe or if extra beats are very frequent or are causing problems. An underlying cause, if discovered, may also require treatment.  Treatment may also be needed if there may be a risk for other more serious cardiac arrhythmias.  PREVENTION   Moderation in caffeine, alcohol, and tobacco use may reduce the risk of ectopic heartbeats in some people.  Exercise often helps people who lead a sedentary (inactive) lifestyle. PROGNOSIS  PVC heartbeats are generally harmless and do not need treatment.  RISKS AND COMPLICATIONS   Ventricular tachycardia (occasionally).  There usually are no complications.  Other  arrhythmias (occasionally). SEEK IMMEDIATE MEDICAL CARE IF:   You feel palpitations that are frequent or continual.  You develop chest pain or other problems such as shortness of breath, sweating, or nausea and vomiting.  You become light-headed or faint (pass out).  You get worse or do not improve with treatment. Document Released: 08/07/2003 Document Revised: 03/14/2011 Document Reviewed: 02/16/2007 [facility] Patient Information 2015 [place], [place]. This information is not intended to replace advice given to you by your health care provider. Make sure you discuss any questions you have with your health care provider.  Potassium Salts tablets, extended-release tablets or capsules What is this medicine? POTASSIUM (poe TASS i um) is a natural salt that is important for the heart, muscles, and nerves. It is found in many foods and is normally supplied by a well balanced diet. This medicine is used to treat low potassium. This medicine may be used for other purposes; ask your health care provider or pharmacist if you have questions. COMMON BRAND NAME(S): ED-K+10, Glu-K, K-10, K-8, K-Dur, K-Tab, Kaon-CL, Klor-Con, Klor-Con M10, Klor-Con M15, Klor-Con M20, Klotrix, Micro-K, Micro-K Extencaps, Slow-K What should I tell my health care provider before I take this medicine? They need to know if you have any of these conditions: -dehydration -diabetes -irregular heartbeat -kidney disease -stomach ulcers or other stomach problems -an unusual or allergic reaction to potassium salts, other medicines, foods, dyes, or preservatives -pregnant or trying to get pregnant -breast-feeding How should I use this medicine? Take this medicine by mouth with a full glass of water. Follow the directions on the prescription label. Take with food. Do not  suck on, crush, or chew this medicine. If you have difficulty swallowing, ask the pharmacist how to take. Take your medicine at regular intervals. Do not take it  more often than directed. Do not stop taking except on your doctor's advice. Talk to your pediatrician regarding the use of this medicine in children. Special care may be needed. Overdosage: If you think you have taken too much of this medicine contact a poison control center or emergency room at once. NOTE: This medicine is only for you. Do not share this medicine with others. What if I miss a dose? If you miss a dose, take it as soon as you can. If it is almost time for your next dose, take only that dose. Do not take double or extra doses. What may interact with this medicine? Do not take this medicine with any of the following medications: -eplerenone -sodium polystyrene sulfonate This medicine may also interact with the following medications: -medicines for blood pressure or heart disease like lisinopril, losartan, quinapril, valsartan -medicines for cold or allergies -medicines for inflammation like ibuprofen, indomethacin -medicines for Parkinson's disease -medicines for the stomach like metoclopramide, dicyclomine, glycopyrrolate -some diuretics This list may not describe all possible interactions. Give your health care provider a list of all the medicines, herbs, non-prescription drugs, or dietary supplements you use. Also tell them if you smoke, drink alcohol, or use illegal drugs. Some items may interact with your medicine. What should I watch for while using this medicine? Visit your doctor or health care professional for regular check ups. You will need lab work done regularly. You may need to be on a special diet while taking this medicine. Ask your doctor. What side effects may I notice from receiving this medicine? Side effects that you should report to your doctor or health care professional as soon as possible: -allergic reactions like skin rash, itching or hives, swelling of the face, lips, or tongue -black, tarry stools -heartburn -irregular heartbeat -numbness or  tingling in hands or feet -pain when swallowing -unusually weak or tired Side effects that usually do not require medical attention (report to your doctor or health care professional if they continue or are bothersome): -diarrhea -nausea -stomach gas -vomiting This list may not describe all possible side effects. Call your doctor for medical advice about side effects. You may report side effects to FDA at 1-800-FDA-1088. Where should I keep my medicine? Keep out of the reach of children. Store at room temperature between 15 and 30 degrees C (59 and 86 degrees F ). Keep bottle closed tightly to protect this medicine from light and moisture. Throw away any unused medicine after the expiration date. NOTE: This sheet is a summary. It may not cover all possible information. If you have questions about this medicine, talk to your doctor, pharmacist, or health care provider.  2015, [employer]. (2007-03-07 11:17:31)

## 2015-12-08 ENCOUNTER — Emergency Department (HOSPITAL_COMMUNITY)
Admission: EM | Admit: 2015-12-08 | Discharge: 2015-12-08 | Disposition: A | Discharge status: Other Acute Inpatient Hospital | Payer: BLUE CROSS/BLUE SHIELD

## 2021-08-02 DIAGNOSIS — G4733 Obstructive sleep apnea (adult) (pediatric): Secondary | ICD-10-CM | POA: Diagnosis not present

## 2021-08-09 DIAGNOSIS — R112 Nausea with vomiting, unspecified: Secondary | ICD-10-CM | POA: Diagnosis not present

## 2021-08-09 DIAGNOSIS — R0789 Other chest pain: Secondary | ICD-10-CM | POA: Diagnosis not present

## 2021-08-09 DIAGNOSIS — R1011 Right upper quadrant pain: Secondary | ICD-10-CM | POA: Diagnosis not present

## 2021-08-09 DIAGNOSIS — Z885 Allergy status to narcotic agent status: Secondary | ICD-10-CM | POA: Diagnosis not present

## 2021-08-09 DIAGNOSIS — E782 Mixed hyperlipidemia: Secondary | ICD-10-CM | POA: Diagnosis not present

## 2021-08-09 DIAGNOSIS — I10 Essential (primary) hypertension: Secondary | ICD-10-CM | POA: Diagnosis not present

## 2021-08-09 DIAGNOSIS — F32A Depression, unspecified: Secondary | ICD-10-CM | POA: Diagnosis not present

## 2021-08-09 DIAGNOSIS — Z79899 Other long term (current) drug therapy: Secondary | ICD-10-CM | POA: Diagnosis not present

## 2021-08-09 DIAGNOSIS — R110 Nausea: Secondary | ICD-10-CM | POA: Diagnosis not present

## 2021-08-09 DIAGNOSIS — R0781 Pleurodynia: Secondary | ICD-10-CM | POA: Diagnosis not present

## 2021-08-09 DIAGNOSIS — R071 Chest pain on breathing: Secondary | ICD-10-CM | POA: Diagnosis not present

## 2021-08-11 DIAGNOSIS — R140 Abdominal distension (gaseous): Secondary | ICD-10-CM | POA: Diagnosis not present

## 2021-08-11 DIAGNOSIS — K5900 Constipation, unspecified: Secondary | ICD-10-CM | POA: Diagnosis not present

## 2021-08-11 DIAGNOSIS — R1011 Right upper quadrant pain: Secondary | ICD-10-CM | POA: Diagnosis not present

## 2021-08-13 DIAGNOSIS — F988 Other specified behavioral and emotional disorders with onset usually occurring in childhood and adolescence: Secondary | ICD-10-CM | POA: Diagnosis not present

## 2021-08-13 DIAGNOSIS — I10 Essential (primary) hypertension: Secondary | ICD-10-CM | POA: Diagnosis not present

## 2021-08-13 DIAGNOSIS — R1030 Lower abdominal pain, unspecified: Secondary | ICD-10-CM | POA: Diagnosis not present

## 2021-08-13 DIAGNOSIS — J301 Allergic rhinitis due to pollen: Secondary | ICD-10-CM | POA: Diagnosis not present

## 2021-08-13 DIAGNOSIS — R110 Nausea: Secondary | ICD-10-CM | POA: Diagnosis not present

## 2021-08-13 DIAGNOSIS — K429 Umbilical hernia without obstruction or gangrene: Secondary | ICD-10-CM | POA: Diagnosis not present

## 2021-08-13 DIAGNOSIS — F418 Other specified anxiety disorders: Secondary | ICD-10-CM | POA: Diagnosis not present

## 2021-08-13 DIAGNOSIS — Z79899 Other long term (current) drug therapy: Secondary | ICD-10-CM | POA: Diagnosis not present

## 2021-08-13 DIAGNOSIS — N3289 Other specified disorders of bladder: Secondary | ICD-10-CM | POA: Diagnosis not present

## 2021-08-13 DIAGNOSIS — Z888 Allergy status to other drugs, medicaments and biological substances status: Secondary | ICD-10-CM | POA: Diagnosis not present

## 2021-08-13 DIAGNOSIS — R109 Unspecified abdominal pain: Secondary | ICD-10-CM | POA: Diagnosis not present

## 2021-08-13 DIAGNOSIS — I700 Atherosclerosis of aorta: Secondary | ICD-10-CM | POA: Diagnosis not present

## 2021-08-13 DIAGNOSIS — E782 Mixed hyperlipidemia: Secondary | ICD-10-CM | POA: Diagnosis not present

## 2021-08-13 DIAGNOSIS — R1011 Right upper quadrant pain: Secondary | ICD-10-CM | POA: Diagnosis not present

## 2021-08-14 DIAGNOSIS — R109 Unspecified abdominal pain: Secondary | ICD-10-CM | POA: Diagnosis not present

## 2021-08-14 DIAGNOSIS — I700 Atherosclerosis of aorta: Secondary | ICD-10-CM | POA: Diagnosis not present

## 2021-08-14 DIAGNOSIS — K429 Umbilical hernia without obstruction or gangrene: Secondary | ICD-10-CM | POA: Diagnosis not present

## 2021-08-14 DIAGNOSIS — N3289 Other specified disorders of bladder: Secondary | ICD-10-CM | POA: Diagnosis not present

## 2021-08-15 ENCOUNTER — Emergency Department (HOSPITAL_COMMUNITY): Payer: BC Managed Care – PPO

## 2021-08-15 ENCOUNTER — Encounter (HOSPITAL_COMMUNITY): Payer: Self-pay

## 2021-08-15 ENCOUNTER — Other Ambulatory Visit: Payer: Self-pay

## 2021-08-15 ENCOUNTER — Emergency Department (HOSPITAL_COMMUNITY)
Admission: EM | Admit: 2021-08-15 | Discharge: 2021-08-15 | Disposition: A | Discharge status: Home / Self Care | Payer: BC Managed Care – PPO | Attending: Emergency Medicine | Admitting: Emergency Medicine

## 2021-08-15 DIAGNOSIS — R9431 Abnormal electrocardiogram [ECG] [EKG]: Secondary | ICD-10-CM | POA: Diagnosis not present

## 2021-08-15 DIAGNOSIS — R109 Unspecified abdominal pain: Secondary | ICD-10-CM | POA: Diagnosis not present

## 2021-08-15 DIAGNOSIS — R110 Nausea: Secondary | ICD-10-CM | POA: Insufficient documentation

## 2021-08-15 DIAGNOSIS — R1011 Right upper quadrant pain: Secondary | ICD-10-CM | POA: Diagnosis not present

## 2021-08-15 DIAGNOSIS — N281 Cyst of kidney, acquired: Secondary | ICD-10-CM | POA: Diagnosis not present

## 2021-08-15 LAB — COMPREHENSIVE METABOLIC PANEL
ALT: 23 U/L (ref 0–44)
AST: 20 U/L (ref 15–41)
Albumin: 3.9 g/dL (ref 3.5–5.0)
Alkaline Phosphatase: 68 U/L (ref 38–126)
Anion gap: 7 (ref 5–15)
BUN: 13 mg/dL (ref 6–20)
CO2: 25 mmol/L (ref 22–32)
Calcium: 8.5 mg/dL — ABNORMAL LOW (ref 8.9–10.3)
Chloride: 106 mmol/L (ref 98–111)
Creatinine, Ser: 0.77 mg/dL (ref 0.44–1.00)
GFR, Estimated: >60 mL/min (ref >60)
Glucose, Bld: 113 mg/dL — ABNORMAL HIGH (ref 70–99)
Potassium: 3.8 mmol/L (ref 3.5–5.1)
Sodium: 138 mmol/L (ref 135–145)
Total Bilirubin: 0.7 mg/dL (ref 0.3–1.2)
Total Protein: 7.3 g/dL (ref 6.5–8.1)

## 2021-08-15 LAB — CBC WITH DIFFERENTIAL/PLATELET
Abs Immature Granulocytes: 0.01 10*3/uL (ref 0.00–0.07)
Basophils Absolute: 0 10*3/uL (ref 0.0–0.1)
Basophils Relative: 1 %
Eosinophils Absolute: 0.1 10*3/uL (ref 0.0–0.5)
Eosinophils Relative: 3 %
HCT: 40.1 % (ref 36.0–46.0)
Hemoglobin: 13.5 g/dL (ref 12.0–15.0)
Immature Granulocytes: 0 %
Lymphocytes Relative: 31 %
Lymphs Abs: 1.5 10*3/uL (ref 0.7–4.0)
MCH: 30 pg (ref 26.0–34.0)
MCHC: 33.7 g/dL (ref 30.0–36.0)
MCV: 89.1 fL (ref 80.0–100.0)
Monocytes Absolute: 0.3 10*3/uL (ref 0.1–1.0)
Monocytes Relative: 6 %
Neutro Abs: 2.8 10*3/uL (ref 1.7–7.7)
Neutrophils Relative %: 59 %
Platelets: 269 10*3/uL (ref 150–400)
RBC: 4.5 MIL/uL (ref 3.87–5.11)
RDW: 12.4 % (ref 11.5–15.5)
WBC: 4.7 10*3/uL (ref 4.0–10.5)
nRBC: 0 % (ref 0.0–0.2)

## 2021-08-15 LAB — URINALYSIS, ROUTINE W REFLEX MICROSCOPIC
Bilirubin Urine: NEGATIVE
Glucose, UA: NEGATIVE mg/dL
Hgb urine dipstick: NEGATIVE
Ketones, ur: NEGATIVE mg/dL
Leukocytes,Ua: NEGATIVE
Nitrite: NEGATIVE
Protein, ur: NEGATIVE mg/dL
Specific Gravity, Urine: 1.009 (ref 1.005–1.030)
pH: 7 (ref 5.0–8.0)

## 2021-08-15 LAB — POC URINE PREG, ED: Preg Test, Ur: NEGATIVE

## 2021-08-15 LAB — LIPASE, BLOOD: Lipase: 33 U/L (ref 11–51)

## 2021-08-15 LAB — PREGNANCY, URINE: Preg Test, Ur: NEGATIVE

## 2021-08-15 MED ORDER — HYDROCODONE-ACETAMINOPHEN 5-325 MG PO TABS: 1.0000 | ORAL_TABLET | ORAL | 0 refills | Status: DC | PRN

## 2021-08-15 MED ORDER — ALUM & MAG HYDROXIDE-SIMETH 200-200-20 MG/5ML PO SUSP
30.0000 mL | Freq: Once | ORAL | Status: AC
  Administered 2021-08-15: 30 mL via ORAL
  Filled 2021-08-15: qty 30

## 2021-08-15 MED ORDER — SODIUM CHLORIDE 0.9 % IV BOLUS
1000.0000 mL | Freq: Once | INTRAVENOUS | Status: AC
  Administered 2021-08-15: 1000 mL via INTRAVENOUS

## 2021-08-15 MED ORDER — PROCHLORPERAZINE MALEATE 10 MG PO TABS: 10.0000 mg | ORAL_TABLET | Freq: Two times a day (BID) | ORAL | 0 refills | Status: DC | PRN

## 2021-08-15 MED ORDER — ONDANSETRON HCL 4 MG/2ML IJ SOLN
4.0000 mg | Freq: Once | INTRAMUSCULAR | Status: AC
  Administered 2021-08-15: 4 mg via INTRAVENOUS
  Filled 2021-08-15: qty 2

## 2021-08-15 MED ORDER — LIDOCAINE VISCOUS HCL 2 % MT SOLN
15.0000 mL | Freq: Once | OROMUCOSAL | Status: AC
  Administered 2021-08-15: 15 mL via ORAL
  Filled 2021-08-15: qty 15

## 2021-08-15 MED ORDER — IOHEXOL 300 MG/ML  SOLN
100.0000 mL | Freq: Once | INTRAMUSCULAR | Status: AC | PRN
Start: 2021-08-15 — End: 2021-08-15
  Administered 2021-08-15: 100 mL via INTRAVENOUS

## 2021-08-15 MED ORDER — MORPHINE SULFATE (PF) 4 MG/ML IV SOLN
4.0000 mg | Freq: Once | INTRAVENOUS | Status: AC
  Administered 2021-08-15: 4 mg via INTRAVENOUS
  Filled 2021-08-15: qty 1

## 2021-08-15 NOTE — ED Provider Notes (Signed)
[facility] EMERGENCY DEPARTMENT Provider Note  CSN: [id number] Arrival date & time: 08/15/21 0737  Chief Complaint(s) Abdominal Pain  HPI [name] is a 41 y.o. female without significant past medical history presenting to the emergency department with right upper quadrant abdominal pain.  She reports the pain is worse with eating.  She reports the pain is always there but gets worse.  She reports the pain radiates to the back.  No shortness of breath.  She reports associated nausea, no vomiting.  She reports some radiation to the right lower chest.  She denies cough, shortness of breath, fevers, chills, diarrhea, constipation.  She reports that she has had an ultrasound, which was "contracted ", and negative CT scan, and is followed up with GI, and has plans to obtain repeat ultrasound and HIDA scan.  She reports that she came today because the pain was severe.  She continues to take ibuprofen and Tylenol without significant improvement.  No urinary symptoms   Past Medical History Past Medical History:  Diagnosis Date  . Headache(784.0)   . Infertility associated with anovulation   . PP care, s/p C/S 12/24 12/27/2010   Patient Active Problem List   Diagnosis Date Noted  . Anemia 01/01/2011  . Severe preeclampsia 12/27/2010  . PP care, s/p C/S 12/24 12/27/2010  . Twin pregnancy, twins dichorionic and diamniotic 12/23/2010   Home Medication(s) Prior to Admission medications   Medication Sig Start Date End Date Taking? Authorizing Provider  HYDROcodone-acetaminophen (NORCO/VICODIN) 5-325 MG tablet Take 1 tablet by mouth every 4 (four) hours as needed. 08/15/21  Yes Cristie Hem, MD  prochlorperazine (COMPAZINE) 10 MG tablet Take 1 tablet (10 mg total) by mouth 2 (two) times daily as needed for nausea or vomiting. 08/15/21  Yes Cristie Hem, MD  ferrous sulfate 325 (65 FE) MG tablet Take 1 tablet (325 mg total) by mouth 2 (two) times daily with a meal. 01/01/11 01/01/12   Azucena Fallen, MD  hydrochlorothiazide (MICROZIDE) 12.5 MG capsule Take 2 capsules (25 mg total) by mouth daily. 01/01/11 01/01/12  Azucena Fallen, MD  NIFEdipine (PROCARDIA-XL/ADALAT CC) 60 MG 24 hr tablet Take 1 tablet (60 mg total) by mouth daily. 01/01/11 01/01/12  Azucena Fallen, MD  ondansetron (ZOFRAN ODT) 4 MG disintegrating tablet '4mg'$  ODT q4 hours prn nausea/vomit 09/18/13   Hess, Bailey Mech M, PA-C  potassium chloride SA (K-DUR,KLOR-CON) 20 MEQ tablet Take 1 tablet (20 mEq total) by mouth 2 (two) times daily. 01/09/24   [name], MD  prenatal vitamin w/FE, FA (PRENATAL 1 + 1) 27-1 MG TABS Take 1 tablet by mouth daily.      [provider]  sertraline (ZOLOFT) 50 MG tablet Take 1 tablet (50 mg total) by mouth daily. 01/01/11 01/01/12  Azucena Fallen, MD  SUMAtriptan (IMITREX) 25 MG tablet Take 25 mg by mouth as needed for migraine. May repeat in 2 hours if headache persists or recurs.    [provider]  zolpidem (AMBIEN) 5 MG tablet Take 5 mg by mouth at bedtime as needed. sleep     [provider]  Past Surgical History Past Surgical History:  Procedure Laterality Date  . CESAREAN SECTION  12/27/2010   Procedure: CESAREAN SECTION;  Surgeon: Elveria Royals;  Location: [facility];  Service: Gynecology;  Laterality: N/A;  Primary Cesarian Section with Bilateral Tubal Ligations; Twin Birth  . partial amputation of rt great toe    . TONSILLECTOMY     Family History No family history on file.  Social History Social History   Tobacco Use  . Smoking status: Never  . Smokeless tobacco: Never  Substance Use Topics  . Alcohol use: No  . Drug use: No   Allergies Tramadol  Review of Systems Review of Systems  All other systems reviewed and are negative.   Physical Exam Vital Signs  I have reviewed the triage vital signs BP (!)  162/99   Pulse 73   Temp 97.9 F (36.6 C) (Oral)   Resp 16   Ht '5\' 3"'$  (1.6 m)   Wt 86.2 kg   LMP 08/05/2021   SpO2 99%   BMI 33.66 kg/m  Physical Exam Vitals and nursing note reviewed.  Constitutional:      General: She is not in acute distress.    Appearance: She is well-developed.  HENT:     Head: Normocephalic and atraumatic.     Mouth/Throat:     Mouth: Mucous membranes are moist.  Eyes:     Pupils: Pupils are equal, round, and reactive to light.  Cardiovascular:     Rate and Rhythm: Normal rate and regular rhythm.     Heart sounds: No murmur heard. Pulmonary:     Effort: Pulmonary effort is normal. No respiratory distress.     Breath sounds: Normal breath sounds.  Abdominal:     General: Abdomen is flat.     Palpations: Abdomen is soft.     Tenderness: There is abdominal tenderness in the right upper quadrant.  Musculoskeletal:        General: No tenderness.     Right lower leg: No edema.     Left lower leg: No edema.  Skin:    General: Skin is warm and dry.  Neurological:     General: No focal deficit present.     Mental Status: She is alert. Mental status is at baseline.  Psychiatric:        Mood and Affect: Mood normal.        Behavior: Behavior normal.     ED Results and Treatments Labs (all labs ordered are listed, but only abnormal results are displayed) Labs Reviewed  COMPREHENSIVE METABOLIC PANEL - Abnormal; Notable for the following components:      Result Value   Glucose, Bld 113 (*)    Calcium 8.5 (*)    All other components within normal limits  URINALYSIS, ROUTINE W REFLEX MICROSCOPIC - Abnormal; Notable for the following components:   Color, Urine STRAW (*)    All other components within normal limits  LIPASE, BLOOD  CBC WITH DIFFERENTIAL/PLATELET  PREGNANCY, URINE  POC URINE PREG, ED  Radiology No results  found.  Pertinent labs & imaging results that were available during my care of the patient were reviewed by me and considered in my medical decision making (see MDM for details).  Medications Ordered in ED Medications  morphine (PF) 4 MG/ML injection 4 mg (4 mg Intravenous Given 08/15/21 0838)  ondansetron (ZOFRAN) injection 4 mg (4 mg Intravenous Given 08/15/21 0837)  sodium chloride 0.9 % bolus 1,000 mL (0 mLs Intravenous Stopped 08/15/21 0934)  iohexol (OMNIPAQUE) 300 MG/ML solution 100 mL (100 mLs Intravenous Contrast Given 08/15/21 0952)  alum & mag hydroxide-simeth (MAALOX/MYLANTA) 200-200-20 MG/5ML suspension 30 mL (30 mLs Oral Given 08/15/21 1038)    And  lidocaine (XYLOCAINE) 2 % viscous mouth solution 15 mL (15 mLs Oral Given 08/15/21 1038)                                                                                                                                     Procedures Procedures  (including critical care time)  Medical Decision Making / ED Course   MDM:  41 year old female, presenting to the emergency department with right upper quadrant pain.  On exam, patient has right upper quadrant pain.  Vital signs reassuring without fever, tachycardia.  Differential includes biliary colic, cholecystitis, gastritis, pancreatitis.  Doubt perforation without peritonitic signs.  Doubt occult PE, patient PERC negative.  Doubt pneumonia without respiratory symptoms.  No urinary symptoms to suggest pyelonephritis and no CVA tenderness.  Give progressive and worsening pain, will obtain repeat imaging to further evaluate as well as lab testing.  Will treat pain.  Will reassess.  Clinical Course as of 08/16/21 1014  Sun Aug 15, 2021  1046 CT negative for acute intraabdominal process. Advised close follow up with GI doctor.  [WS]  1050 Will discharge patient to home. All questions answered. Patient comfortable with plan of discharge. Return precautions discussed with patient and  specified on the after visit summary.  [WS]    Clinical Course User Index [WS] Cristie Hem, MD     Additional history obtained: -Additional history obtained from medical record -External records from outside source obtained and reviewed including: Chart review including previous notes, labs, imaging, consultation notes   Lab Tests: -I ordered, reviewed, and interpreted labs.   The pertinent results include:   Labs Reviewed  COMPREHENSIVE METABOLIC PANEL - Abnormal; Notable for the following components:      Result Value   Glucose, Bld 113 (*)    Calcium 8.5 (*)    All other components within normal limits  URINALYSIS, ROUTINE W REFLEX MICROSCOPIC - Abnormal; Notable for the following components:   Color, Urine STRAW (*)    All other components within normal limits  LIPASE, BLOOD  CBC WITH DIFFERENTIAL/PLATELET  PREGNANCY, URINE  POC URINE PREG, ED      EKG   EKG Interpretation  Date/Time:  Sunday August 15 2021 07:45:29 EDT Ventricular Rate:  71 PR Interval:  153 QRS Duration: 99 QT Interval:  381 QTC Calculation: 414 R Axis:   57 Text Interpretation: Sinus rhythm Low voltage, precordial leads Borderline T wave abnormalities Confirmed by Garnette Gunner [phone]) on 08/15/2021 8:22:12 AM         Imaging Studies ordered: I ordered imaging studies including CT A/P I independently visualized and interpreted imaging. I agree with the radiologist interpretation   Medicines ordered and prescription drug management: Meds ordered this encounter  Medications  . morphine (PF) 4 MG/ML injection 4 mg  . ondansetron (ZOFRAN) injection 4 mg  . sodium chloride 0.9 % bolus 1,000 mL  . iohexol (OMNIPAQUE) 300 MG/ML solution 100 mL  . AND Linked Order Group   . alum & mag hydroxide-simeth (MAALOX/MYLANTA) 200-200-20 MG/5ML suspension 30 mL   . lidocaine (XYLOCAINE) 2 % viscous mouth solution 15 mL  . HYDROcodone-acetaminophen (NORCO/VICODIN) 5-325 MG tablet     Sig: Take 1 tablet by mouth every 4 (four) hours as needed.    Dispense:  13 tablet    Refill:  0  . prochlorperazine (COMPAZINE) 10 MG tablet    Sig: Take 1 tablet (10 mg total) by mouth 2 (two) times daily as needed for nausea or vomiting.    Dispense:  10 tablet    Refill:  0    -I have reviewed the patients home medicines and have made adjustments as needed    Cardiac Monitoring: The patient was maintained on a cardiac monitor.  I personally viewed and interpreted the cardiac monitored which showed an underlying rhythm of: NSR   Reevaluation: After the interventions noted above, I reevaluated the patient and found that they have :improved  Co morbidities that complicate the patient evaluation . Past Medical History:  Diagnosis Date  . Headache(784.0)   . Infertility associated with anovulation   . PP care, s/p C/S 12/24 12/27/2010      Dispostion: Discharge     Final Clinical Impression(s) / ED Diagnoses Final diagnoses:  RUQ abdominal pain     This chart was dictated using voice recognition software.  Despite best efforts to proofread,  errors can occur which can change the documentation meaning.    Cristie Hem, MD 08/16/21 1014

## 2021-08-15 NOTE — ED Triage Notes (Signed)
Pt presents to ED with complaints of upper right abdominal pain. Pt has had 2 ER visits. Pt has seen GI, states does not have gallstones. Pt states they wants her to have another [place] and HIDA scan. Pt states the pain is unbearable and radiating down into right hip. Pt states she has been nauseated.

## 2021-08-15 NOTE — Discharge Instructions (Addendum)
We evaluated you today in the emergency department for your abdominal pain.  Your lab testing and CT scan was reassuring.  There was no sign of infection to your gallbladder.  Your liver tests were reassuring.  We did not find an explanation for your pain in the emergency department.  Please follow-up as scheduled with your GI doctor and ultrasound appointment.  Please call Dr. Arnoldo Morale with general surgery to follow-up about whether you need surgery for your gallbladder.  Please take Tylenol and Motrin for pain as needed.  If your symptoms are unrelieved with these medications, please take Norco, but be aware that Dwight also contains Tylenol so try not to take this medication with Tylenol.  Please try Compazine for your nausea.  These return to the emergency department if you experience worsening symptoms, vomiting, fevers, trouble breathing, or any other new concerning symptoms.

## 2021-08-16 DIAGNOSIS — R1010 Upper abdominal pain, unspecified: Secondary | ICD-10-CM | POA: Diagnosis not present

## 2021-08-16 DIAGNOSIS — K829 Disease of gallbladder, unspecified: Secondary | ICD-10-CM | POA: Diagnosis not present

## 2021-08-16 DIAGNOSIS — R109 Unspecified abdominal pain: Secondary | ICD-10-CM | POA: Diagnosis not present

## 2021-08-16 DIAGNOSIS — R1011 Right upper quadrant pain: Secondary | ICD-10-CM | POA: Diagnosis not present

## 2021-08-17 DIAGNOSIS — B9681 Helicobacter pylori [H. pylori] as the cause of diseases classified elsewhere: Secondary | ICD-10-CM | POA: Diagnosis not present

## 2021-08-17 DIAGNOSIS — K2970 Gastritis, unspecified, without bleeding: Secondary | ICD-10-CM | POA: Diagnosis not present

## 2021-08-17 DIAGNOSIS — R1011 Right upper quadrant pain: Secondary | ICD-10-CM | POA: Diagnosis not present

## 2021-08-18 ENCOUNTER — Other Ambulatory Visit (HOSPITAL_COMMUNITY): Payer: Self-pay | Admitting: Surgery

## 2021-08-18 ENCOUNTER — Other Ambulatory Visit: Payer: Self-pay | Admitting: Surgery

## 2021-08-18 DIAGNOSIS — R1011 Right upper quadrant pain: Secondary | ICD-10-CM | POA: Diagnosis not present

## 2021-08-18 DIAGNOSIS — K8020 Calculus of gallbladder without cholecystitis without obstruction: Secondary | ICD-10-CM

## 2021-08-23 ENCOUNTER — Ambulatory Visit (HOSPITAL_COMMUNITY)
Admission: RE | Admit: 2021-08-23 | Discharge: 2021-08-23 | Disposition: A | Discharge status: Home / Self Care | Payer: BC Managed Care – PPO | Source: Ambulatory Visit | Attending: Surgery | Admitting: Surgery

## 2021-08-23 DIAGNOSIS — K8020 Calculus of gallbladder without cholecystitis without obstruction: Secondary | ICD-10-CM | POA: Diagnosis not present

## 2021-08-23 DIAGNOSIS — R1011 Right upper quadrant pain: Secondary | ICD-10-CM | POA: Insufficient documentation

## 2021-08-25 DIAGNOSIS — N838 Other noninflammatory disorders of ovary, fallopian tube and broad ligament: Secondary | ICD-10-CM | POA: Diagnosis not present

## 2021-08-25 DIAGNOSIS — R102 Pelvic and perineal pain: Secondary | ICD-10-CM | POA: Diagnosis not present

## 2021-08-27 ENCOUNTER — Ambulatory Visit: Payer: Self-pay | Admitting: Surgery

## 2021-08-27 DIAGNOSIS — R1011 Right upper quadrant pain: Secondary | ICD-10-CM | POA: Diagnosis not present

## 2021-08-30 NOTE — Progress Notes (Signed)
COVID Vaccine received:  '[x]'$  No '[]'$  Yes Date of any COVID positive Test in last 90 days: none  PCP - [facility], [place], [place] Cardiologist - none  Chest x-ray - n/a EKG -  08-16-21  Epic Stress Test - n/a ECHO - n/a Cardiac Cath - n/a  Pacemaker/ICD device     '[x]'$  N/A Spinal Cord Stimulator:'[x]'$  No '[]'$  Yes     Other Implants:   Bowel Prep - light diet,   History of Sleep Apnea? '[]'$  No '[x]'$  Yes  mild OSA Sleep Study Date:   CPAP used?- '[x]'$  No '[]'$  Yes    Does the patient monitor blood sugar? '[]'$  No '[]'$  Yes  '[x]'$  N/A  Blood Thinner Instructions: none Aspirin Instructions:none Last Dose:  ERAS Protocol Ordered: '[]'$  No  '[x]'$  Yes  no drink ordered PRE-SURGERY '[]'$  ENSURE  '[]'$  G2   Comments: No labs performed at PST, had bloodwork and EKG done in the ED 08-15-21  Activity level: Patient can climb a flight of stairs without difficulty;   '[x]'$  No CP  '[x]'$  No SOB,    Anesthesia review: HTN   Patient denies shortness of breath, fever, cough and chest pain at PAT appointment.  Patient verbalized understanding and agreement to the Pre-Surgical Instructions that were given to them at this PAT appointment. Patient was also educated of the need to review these PAT instructions again prior to his/her surgery.I reviewed the appropriate phone numbers to call if they have any and questions or concerns.

## 2021-08-30 NOTE — Patient Instructions (Signed)
DUE TO SPACE LIMITATIONS, ONLY TWO VISITORS  (aged 42 and older) ARE ALLOWED TO COME WITH YOU AND STAY IN THE WAITING ROOM DURING YOUR PRE OP AND PROCEDURE.   **NO VISITORS ARE ALLOWED IN THE SHORT STAY AREA OR RECOVERY ROOM!!**  You are not required to quarantine at this time prior to your surgery. However, you must do this: Hand Hygiene often Do NOT share personal items Notify your provider if you are in close contact with someone who has COVID or you develop fever 100.4 or greater, new onset of sneezing, cough, sore throat, shortness of breath or body aches.       Your procedure is scheduled on:  Friday September 03, 2021  Report to [facility] Main Entrance.  Report to admitting at: 07:15    AM  +++++Call this number if you have any questions or problems the morning of surgery [phone]  Do not eat food :After Midnight the night prior to your surgery/procedure.  After Midnight you may have the following liquids until  06:30 AM  DAY OF SURGERY  Clear Liquid Diet Water Black Coffee (sugar ok, NO MILK/CREAM OR CREAMERS)  Tea (sugar ok, NO MILK/CREAM OR CREAMERS) regular and decaf                             Plain Jell-O (NO RED)                                           Fruit ices (not with fruit pulp, NO RED)                                     Popsicles (NO RED)                                                                  Juice: apple, WHITE grape, WHITE cranberry Sports drinks like Gatorade (NO RED)                FOLLOW BOWEL PREP AND ANY ADDITIONAL PRE OP INSTRUCTIONS YOU RECEIVED FROM YOUR SURGEON'S OFFICE!!!   Oral Hygiene is also important to reduce your risk of infection.        Remember - BRUSH YOUR TEETH THE MORNING OF SURGERY WITH YOUR REGULAR TOOTHPASTE   Take ONLY these medicines the morning of surgery with A SIP OF WATER: Sertaline, Nifedipine (Procardia), if needed you may take Compazine, Imitrex   Bring CPAP mask and tubing day of surgery.                    You may not have any metal on your body including hair pins, jewelry, and body piercing  Do not wear make-up, lotions, powders, perfumes, or deodorant  Do not wear nail polish including gel and S&S, artificial / acrylic nails, or any other type of covering on natural nails including finger and toenails. If you have artificial nails, gel coating, etc., that needs to be removed by a nail salon, Please have this removed prior to  surgery. Not doing so may mean that your surgery could be cancelled or delayed if the Surgeon or anesthesia staff feels like they are unable to monitor you safely.   Do not shave 48 hours prior to surgery to avoid nicks in your skin which may contribute to postoperative infections.   Contacts, Hearing Aids, dentures or bridgework may not be worn into surgery.   DO NOT [place]. PHARMACY WILL DISPENSE MEDICATIONS LISTED ON YOUR MEDICATION LIST TO YOU DURING YOUR ADMISSION [place]!   Patients discharged on the day of surgery will not be allowed to drive home.  Someone NEEDS to stay with you for the first 24 hours after anesthesia.  Special Instructions: Bring a copy of your healthcare power of attorney and living will documents the day of surgery, if you wish to have them scanned into your [facility] Medical Records- EPIC  Please read over the following fact sheets you were given: IF YOU HAVE QUESTIONS ABOUT YOUR PRE-OP INSTRUCTIONS, PLEASE CALL [phone]  ([place])   [place] - Preparing for Surgery Before surgery, you can play an important role.  Because skin is not sterile, your skin needs to be as free of germs as possible.  You can reduce the number of germs on your skin by washing with CHG (chlorahexidine gluconate) soap before surgery.  CHG is an antiseptic cleaner which kills germs and bonds with the skin to continue killing germs even after washing. Please DO NOT use if you have an allergy to CHG or  antibacterial soaps.  If your skin becomes reddened/irritated stop using the CHG and inform your nurse when you arrive at Short Stay. Do not shave (including legs and underarms) for at least 48 hours prior to the first CHG shower.  You may shave your face/neck.  Please follow these instructions carefully:  1.  Shower with CHG Soap the night before surgery and the  morning of surgery.  2.  If you choose to wash your hair, wash your hair first as usual with your normal  shampoo.  3.  After you shampoo, rinse your hair and body thoroughly to remove the shampoo.                             4.  Use CHG as you would any other liquid soap.  You can apply chg directly to the skin and wash.  Gently with a scrungie or clean washcloth.  5.  Apply the CHG Soap to your body ONLY FROM THE NECK DOWN.   Do not use on face/ open                           Wound or open sores. Avoid contact with eyes, ears mouth and genitals (private parts).                       Wash face,  Genitals (private parts) with your normal soap.             6.  Wash thoroughly, paying special attention to the area where your  surgery  will be performed.  7.  Thoroughly rinse your body with warm water from the neck down.  8.  DO NOT shower/wash with your normal soap after using and rinsing off the CHG Soap.            9.  Pat yourself dry with a clean towel.            10.  Wear clean pajamas.            11.  Place clean sheets on your bed the night of your first shower and do not  sleep with pets.  ON THE DAY OF SURGERY : Do not apply any lotions/deodorants the morning of surgery.  Please wear clean clothes to the hospital/surgery center.    FAILURE TO FOLLOW THESE INSTRUCTIONS MAY RESULT IN THE CANCELLATION OF YOUR SURGERY  PATIENT SIGNATURE_________________________________  NURSE SIGNATURE__________________________________  ________________________________________________________________________

## 2021-09-01 ENCOUNTER — Encounter (HOSPITAL_COMMUNITY)
Admission: RE | Admit: 2021-09-01 | Discharge: 2021-09-01 | Disposition: A | Discharge status: Home / Self Care | Payer: BC Managed Care – PPO | Source: Ambulatory Visit | Attending: Surgery | Admitting: Surgery

## 2021-09-01 ENCOUNTER — Other Ambulatory Visit: Payer: Self-pay

## 2021-09-01 ENCOUNTER — Encounter (HOSPITAL_COMMUNITY): Payer: Self-pay

## 2021-09-01 VITALS — BP 131/96 | HR 70 | Temp 98.2°F | Resp 14 | Ht 63.0 in | Wt 186.0 lb

## 2021-09-01 DIAGNOSIS — Z01818 Encounter for other preprocedural examination: Secondary | ICD-10-CM

## 2021-09-01 DIAGNOSIS — K219 Gastro-esophageal reflux disease without esophagitis: Secondary | ICD-10-CM | POA: Diagnosis not present

## 2021-09-01 DIAGNOSIS — K811 Chronic cholecystitis: Secondary | ICD-10-CM | POA: Diagnosis not present

## 2021-09-01 DIAGNOSIS — R519 Headache, unspecified: Secondary | ICD-10-CM | POA: Diagnosis not present

## 2021-09-01 DIAGNOSIS — G4730 Sleep apnea, unspecified: Secondary | ICD-10-CM | POA: Diagnosis not present

## 2021-09-01 DIAGNOSIS — Z6832 Body mass index (BMI) 32.0-32.9, adult: Secondary | ICD-10-CM | POA: Diagnosis not present

## 2021-09-01 DIAGNOSIS — Z79899 Other long term (current) drug therapy: Secondary | ICD-10-CM | POA: Diagnosis not present

## 2021-09-01 DIAGNOSIS — K8050 Calculus of bile duct without cholangitis or cholecystitis without obstruction: Secondary | ICD-10-CM | POA: Diagnosis not present

## 2021-09-01 DIAGNOSIS — I10 Essential (primary) hypertension: Secondary | ICD-10-CM | POA: Diagnosis not present

## 2021-09-01 DIAGNOSIS — E669 Obesity, unspecified: Secondary | ICD-10-CM | POA: Diagnosis not present

## 2021-09-01 HISTORY — DX: Essential (primary) hypertension: I10

## 2021-09-03 ENCOUNTER — Ambulatory Visit (HOSPITAL_COMMUNITY): Payer: BC Managed Care – PPO | Admitting: Certified Registered"

## 2021-09-03 ENCOUNTER — Other Ambulatory Visit: Payer: Self-pay

## 2021-09-03 ENCOUNTER — Ambulatory Visit (HOSPITAL_COMMUNITY)
Admission: RE | Admit: 2021-09-03 | Discharge: 2021-09-03 | Disposition: A | Discharge status: Home / Self Care | Payer: BC Managed Care – PPO | Attending: Surgery | Admitting: Surgery

## 2021-09-03 ENCOUNTER — Encounter (HOSPITAL_COMMUNITY): Payer: Self-pay | Admitting: Surgery

## 2021-09-03 ENCOUNTER — Encounter (HOSPITAL_COMMUNITY)
Admission: RE | Disposition: A | Discharge status: Home / Self Care | Payer: Self-pay | Source: Home / Self Care | Attending: Surgery

## 2021-09-03 DIAGNOSIS — E669 Obesity, unspecified: Secondary | ICD-10-CM | POA: Insufficient documentation

## 2021-09-03 DIAGNOSIS — R519 Headache, unspecified: Secondary | ICD-10-CM | POA: Insufficient documentation

## 2021-09-03 DIAGNOSIS — I10 Essential (primary) hypertension: Secondary | ICD-10-CM | POA: Diagnosis not present

## 2021-09-03 DIAGNOSIS — Z79899 Other long term (current) drug therapy: Secondary | ICD-10-CM | POA: Insufficient documentation

## 2021-09-03 DIAGNOSIS — K8050 Calculus of bile duct without cholangitis or cholecystitis without obstruction: Secondary | ICD-10-CM | POA: Diagnosis not present

## 2021-09-03 DIAGNOSIS — K811 Chronic cholecystitis: Secondary | ICD-10-CM | POA: Diagnosis not present

## 2021-09-03 DIAGNOSIS — K8010 Calculus of gallbladder with chronic cholecystitis without obstruction: Secondary | ICD-10-CM | POA: Diagnosis not present

## 2021-09-03 DIAGNOSIS — Z01818 Encounter for other preprocedural examination: Secondary | ICD-10-CM

## 2021-09-03 DIAGNOSIS — G4730 Sleep apnea, unspecified: Secondary | ICD-10-CM | POA: Diagnosis not present

## 2021-09-03 DIAGNOSIS — K219 Gastro-esophageal reflux disease without esophagitis: Secondary | ICD-10-CM | POA: Diagnosis not present

## 2021-09-03 DIAGNOSIS — Z6832 Body mass index (BMI) 32.0-32.9, adult: Secondary | ICD-10-CM | POA: Diagnosis not present

## 2021-09-03 HISTORY — PX: CHOLECYSTECTOMY: SHX55

## 2021-09-03 SURGERY — LAPAROSCOPIC CHOLECYSTECTOMY
Anesthesia: General

## 2021-09-03 MED ORDER — MIDAZOLAM HCL 2 MG/2ML IJ SOLN
INTRAMUSCULAR | Status: AC
  Filled 2021-09-03: qty 2

## 2021-09-03 MED ORDER — ROCURONIUM BROMIDE 10 MG/ML (PF) SYRINGE
PREFILLED_SYRINGE | INTRAVENOUS | Status: DC | PRN
  Administered 2021-09-03: 60 mg via INTRAVENOUS

## 2021-09-03 MED ORDER — ACETAMINOPHEN 500 MG PO TABS
1000.0000 mg | ORAL_TABLET | ORAL | Status: AC
  Administered 2021-09-03: 1000 mg via ORAL
  Filled 2021-09-03: qty 2

## 2021-09-03 MED ORDER — DEXAMETHASONE SODIUM PHOSPHATE 10 MG/ML IJ SOLN
INTRAMUSCULAR | Status: DC | PRN
  Administered 2021-09-03: 8 mg via INTRAVENOUS

## 2021-09-03 MED ORDER — LIDOCAINE 2% (20 MG/ML) 5 ML SYRINGE
INTRAMUSCULAR | Status: DC | PRN
  Administered 2021-09-03: 80 mg via INTRAVENOUS

## 2021-09-03 MED ORDER — FENTANYL CITRATE PF 50 MCG/ML IJ SOSY
PREFILLED_SYRINGE | INTRAMUSCULAR | Status: AC
  Administered 2021-09-03: 25 ug via INTRAVENOUS
  Filled 2021-09-03: qty 1

## 2021-09-03 MED ORDER — FENTANYL CITRATE (PF) 100 MCG/2ML IJ SOLN
INTRAMUSCULAR | Status: AC
  Filled 2021-09-03: qty 2

## 2021-09-03 MED ORDER — EPHEDRINE SULFATE-NACL 50-0.9 MG/10ML-% IV SOSY
PREFILLED_SYRINGE | INTRAVENOUS | Status: DC | PRN
  Administered 2021-09-03 (×2): 10 mg via INTRAVENOUS

## 2021-09-03 MED ORDER — FENTANYL CITRATE PF 50 MCG/ML IJ SOSY
25.0000 ug | PREFILLED_SYRINGE | INTRAMUSCULAR | Status: DC | PRN
  Administered 2021-09-03: 50 ug via INTRAVENOUS

## 2021-09-03 MED ORDER — LACTATED RINGERS IV SOLN: INTRAVENOUS | Status: DC

## 2021-09-03 MED ORDER — CEFAZOLIN SODIUM-DEXTROSE 2-4 GM/100ML-% IV SOLN
2.0000 g | INTRAVENOUS | Status: AC
  Administered 2021-09-03: 2 g via INTRAVENOUS
  Filled 2021-09-03: qty 100

## 2021-09-03 MED ORDER — PROPOFOL 10 MG/ML IV BOLUS
INTRAVENOUS | Status: AC
  Filled 2021-09-03: qty 20

## 2021-09-03 MED ORDER — FENTANYL CITRATE (PF) 250 MCG/5ML IJ SOLN
INTRAMUSCULAR | Status: DC | PRN
  Administered 2021-09-03: 50 ug via INTRAVENOUS
  Administered 2021-09-03: 100 ug via INTRAVENOUS

## 2021-09-03 MED ORDER — ONDANSETRON HCL 4 MG/2ML IJ SOLN
INTRAMUSCULAR | Status: DC | PRN
  Administered 2021-09-03: 4 mg via INTRAVENOUS

## 2021-09-03 MED ORDER — BUPIVACAINE-EPINEPHRINE (PF) 0.25% -1:200000 IJ SOLN
INTRAMUSCULAR | Status: AC
  Filled 2021-09-03: qty 30

## 2021-09-03 MED ORDER — PROMETHAZINE HCL 25 MG/ML IJ SOLN: 6.2500 mg | INTRAMUSCULAR | Status: DC | PRN

## 2021-09-03 MED ORDER — CHLORHEXIDINE GLUCONATE 0.12 % MT SOLN
15.0000 mL | Freq: Once | OROMUCOSAL | Status: AC
  Administered 2021-09-03: 15 mL via OROMUCOSAL

## 2021-09-03 MED ORDER — MIDAZOLAM HCL 2 MG/2ML IJ SOLN
INTRAMUSCULAR | Status: DC | PRN
  Administered 2021-09-03: 2 mg via INTRAVENOUS

## 2021-09-03 MED ORDER — HYDROCODONE-ACETAMINOPHEN 5-325 MG PO TABS: 1.0000 | ORAL_TABLET | Freq: Four times a day (QID) | ORAL | 0 refills | Status: AC | PRN

## 2021-09-03 MED ORDER — SUGAMMADEX SODIUM 200 MG/2ML IV SOLN
INTRAVENOUS | Status: DC | PRN
  Administered 2021-09-03: 200 mg via INTRAVENOUS

## 2021-09-03 MED ORDER — BUPIVACAINE-EPINEPHRINE 0.25% -1:200000 IJ SOLN
INTRAMUSCULAR | Status: DC | PRN
  Administered 2021-09-03: 30 mL

## 2021-09-03 MED ORDER — DEXAMETHASONE SODIUM PHOSPHATE 10 MG/ML IJ SOLN
INTRAMUSCULAR | Status: AC
  Filled 2021-09-03: qty 1

## 2021-09-03 MED ORDER — 0.9 % SODIUM CHLORIDE (POUR BTL) OPTIME
TOPICAL | Status: DC | PRN
  Administered 2021-09-03: 1000 mL

## 2021-09-03 MED ORDER — LACTATED RINGERS IR SOLN
Status: DC | PRN
  Administered 2021-09-03: 1000 mL

## 2021-09-03 MED ORDER — GABAPENTIN 300 MG PO CAPS
300.0000 mg | ORAL_CAPSULE | ORAL | Status: AC
  Administered 2021-09-03: 300 mg via ORAL
  Filled 2021-09-03: qty 1

## 2021-09-03 MED ORDER — ORAL CARE MOUTH RINSE
15.0000 mL | Freq: Once | OROMUCOSAL | Status: AC
Start: 2021-09-03 — End: 2021-09-03

## 2021-09-03 MED ORDER — ONDANSETRON HCL 4 MG/2ML IJ SOLN
INTRAMUSCULAR | Status: AC
  Filled 2021-09-03: qty 2

## 2021-09-03 MED ORDER — PROPOFOL 10 MG/ML IV BOLUS
INTRAVENOUS | Status: DC | PRN
  Administered 2021-09-03: 170 mg via INTRAVENOUS

## 2021-09-03 MED ORDER — ROCURONIUM BROMIDE 10 MG/ML (PF) SYRINGE
PREFILLED_SYRINGE | INTRAVENOUS | Status: AC
  Filled 2021-09-03: qty 10

## 2021-09-03 SURGICAL SUPPLY — 46 items
APPLIER CLIP 5 13 M/L LIGAMAX5 (MISCELLANEOUS) ×1
BAG COUNTER SPONGE SURGICOUNT (BAG) IMPLANT
CHLORAPREP W/TINT 26 (MISCELLANEOUS) ×1 IMPLANT
CLIP APPLIE 5 13 M/L LIGAMAX5 (MISCELLANEOUS) ×1 IMPLANT
COVER MAYO STAND XLG (MISCELLANEOUS) ×1 IMPLANT
COVER SURGICAL LIGHT HANDLE (MISCELLANEOUS) ×1 IMPLANT
DERMABOND ADVANCED (GAUZE/BANDAGES/DRESSINGS) ×1
DERMABOND ADVANCED .7 DNX12 (GAUZE/BANDAGES/DRESSINGS) ×1 IMPLANT
DRAPE C-ARM 42X120 X-RAY (DRAPES) IMPLANT
ELECT L-HOOK LAP 45CM DISP (ELECTROSURGICAL)
ELECT PENCIL ROCKER SW 15FT (MISCELLANEOUS) ×1 IMPLANT
ELECT REM PT RETURN 15FT ADLT (MISCELLANEOUS) ×1 IMPLANT
ELECTRODE L-HOOK LAP 45CM DISP (ELECTROSURGICAL) IMPLANT
GLOVE BIOGEL PI IND STRL 6 (GLOVE) ×1 IMPLANT
GLOVE BIOGEL PI INDICATOR 6 (GLOVE) ×1
GLOVE BIOGEL PI MICRO 5.5 (GLOVE) ×1
GLOVE BIOGEL PI MICRO STRL 5.5 (GLOVE) ×1 IMPLANT
GOWN STRL REUS W/ TWL LRG LVL3 (GOWN DISPOSABLE) ×1 IMPLANT
GOWN STRL REUS W/TWL LRG LVL3 (GOWN DISPOSABLE) ×1
GRASPER SUT TROCAR 14GX15 (MISCELLANEOUS) IMPLANT
HEMOSTAT SNOW SURGICEL 2X4 (HEMOSTASIS) IMPLANT
IRRIG SUCT STRYKERFLOW 2 WTIP (MISCELLANEOUS) ×1
IRRIGATION SUCT STRKRFLW 2 WTP (MISCELLANEOUS) ×1 IMPLANT
KIT BASIN OR (CUSTOM PROCEDURE TRAY) ×1 IMPLANT
KIT TURNOVER KIT A (KITS) IMPLANT
L-HOOK LAP DISP 36CM (ELECTROSURGICAL) ×1
LHOOK LAP DISP 36CM (ELECTROSURGICAL) ×1 IMPLANT
NDL INSUFFLATION 14GA 120MM (NEEDLE) IMPLANT
NEEDLE INSUFFLATION 14GA 120MM (NEEDLE) IMPLANT
POUCH RETRIEVAL ECOSAC 10 (ENDOMECHANICALS) IMPLANT
POUCH RETRIEVAL ECOSAC 10MM (ENDOMECHANICALS)
SCISSORS LAP 5X35 DISP (ENDOMECHANICALS) ×1 IMPLANT
SET CHOLANGIOGRAPH MIX (MISCELLANEOUS) IMPLANT
SET TUBE SMOKE EVAC HIGH FLOW (TUBING) ×1 IMPLANT
SLEEVE Z-THREAD 5X100MM (TROCAR) ×2 IMPLANT
SPIKE FLUID TRANSFER (MISCELLANEOUS) ×1 IMPLANT
SUT MNCRL AB 4-0 PS2 18 (SUTURE) ×1 IMPLANT
SYS BAG RETRIEVAL 10MM (BASKET) ×1
SYSTEM BAG RETRIEVAL 10MM (BASKET) IMPLANT
TOWEL OR 17X26 10 PK STRL BLUE (TOWEL DISPOSABLE) ×1 IMPLANT
TOWEL OR NON WOVEN STRL DISP B (DISPOSABLE) IMPLANT
TRAY LAPAROSCOPIC (CUSTOM PROCEDURE TRAY) ×1 IMPLANT
TROCAR ADV FIXATION 12X100MM (TROCAR) IMPLANT
TROCAR BALLN 12MMX100 BLUNT (TROCAR) IMPLANT
TROCAR Z THREAD OPTICAL 12X100 (TROCAR) IMPLANT
TROCAR Z-THREAD OPTICAL 5X100M (TROCAR) ×1 IMPLANT

## 2021-09-03 NOTE — Op Note (Signed)
Date: 09/03/21  Patient: [name] MRN: [id number]  Preoperative Diagnosis: Biliary colic Postoperative Diagnosis: Same  Procedure: Laparoscopic cholecystectomy  Surgeon: Michaelle Birks, MD  EBL: Minimal  Anesthesia: General endotracheal  Specimens: Gallbladder  Indications: [name] is a 41 yo female who presented with severe post-prandial RUQ abdominal pain. CT scan was normal, HIDA showed an EF of 85%, and RUQ US showed possible sludge but no gallstones. Her symptoms were consistent with biliary colic and her workup did not show any other etiology for her pain, so after a discussion of the risks and benefits of surgery, she agreed to proceed with cholecystectomy.  Findings: Grossly normal appearance of the gallbladder.  Procedure details: Informed consent was obtained in the preoperative area prior to the procedure. The patient was brought to the operating room and placed on the table in the supine position. General anesthesia was induced and appropriate lines and drains were placed for intraoperative monitoring. Perioperative antibiotics were administered per SCIP guidelines. The abdomen was prepped and draped in the usual sterile fashion. A pre-procedure timeout was taken verifying patient identity, surgical site and procedure to be performed.  A small infraumbilical skin incision was made, the subcutaneous tissue was bluntly spread, and the umbilical stalk was grasped and elevated. A Veress needle was inserted through the fascia and intraperitoneal placement was confirmed with the saline drop test. The abdomen was insufflated andaA 52m trocar was placed. The peritoneal cavity was inspected with no evidence of visceral or vascular injury. Three 528mports were placed in the right subcostal margin, all under direct visualization. The fundus of the gallbladder was grasped and retracted cephalad. The infundibulum was retracted laterally. The cystic triangle was dissected out using cautery  and blunt dissection, and the critical view of safety was obtained. The cystic duct and cystic artery were clipped and ligated, leaving two clips behind on the cystic duct stump. The gallbladder was taken off the liver using cautery, and the specimen was placed in an endocatch bag. The specimen was removed via the umbilical port site. The surgical site was irrigated with saline until the effluent was clear. The gallbladder fossa appeared hemostatic. The cystic duct and artery stumps were visually inspected and there was no evidence of bile leak or bleeding. The umbilical port was removed and a PMI was used to close the fascia at this site using a 0 Vicryl figure-of-eight suture. The remaining ports were removed and the abdomen was desufflated. The skin at all port sites was closed with 4-0 monocryl subcuticular suture. Dermabond was applied.  The patient tolerated the procedure well with no apparent complications. All counts were correct x2 at the end of the procedure. The patient was extubated and taken to PACU in stable condition.  ShMichaelle BirksMD 09/03/21 10:11 AM

## 2021-09-03 NOTE — Discharge Instructions (Addendum)
[facility] [place] [facility] DISCHARGE INSTRUCTIONS  Activity No heavy lifting greater than 15 pounds for 4 weeks after surgery. Ok to shower in 24 hours, but do not bathe or submerge incisions underwater. Do not drive while taking narcotic pain medication.  Wound Care Your incisions are covered with skin glue called Dermabond. This will peel off on its own over time. You may shower and allow warm soapy water to run over your incisions. Gently pat dry. Do not submerge your incision underwater. Monitor your incision for any new redness, tenderness, or drainage.  When to Call us: Fever greater than 100.5 New redness, drainage, or swelling at incision site Severe pain, nausea, or vomiting Jaundice (yellowing of the whites of the eyes or skin)  Follow-up You have an appointment scheduled with Dr. Zenia Resides on September 20, 2021 at 4:20pm. This will be at the [facility] office at [address], [place], [place], [place]. Please arrive at least 15 minutes prior to your scheduled appointment time.  For questions or concerns, please call the office at [phone].

## 2021-09-03 NOTE — Anesthesia Procedure Notes (Signed)
Procedure Name: Intubation Date/Time: 09/03/2021 9:19 AM  Performed by: Eben Burow, CRNAPre-anesthesia Checklist: Patient identified, Emergency Drugs available, Suction available, Patient being monitored and Timeout performed Patient Re-evaluated:Patient Re-evaluated prior to induction Oxygen Delivery Method: Circle system utilized Preoxygenation: Pre-oxygenation with 100% oxygen Induction Type: IV induction Ventilation: Mask ventilation without difficulty Laryngoscope Size: Mac and 4 Grade View: Grade I Tube type: Oral Tube size: 7.0 mm Number of attempts: 1 Airway Equipment and Method: Stylet Placement Confirmation: ETT inserted through vocal cords under direct vision, positive ETCO2 and breath sounds checked- equal and bilateral Secured at: 21 cm Tube secured with: Tape Dental Injury: Teeth and Oropharynx as per pre-operative assessment

## 2021-09-03 NOTE — Anesthesia Preprocedure Evaluation (Addendum)
Anesthesia Evaluation  Patient identified by MRN, date of birth, ID band Patient awake    Reviewed: Allergy & Precautions, NPO status , Patient's Chart, lab work & pertinent test results  Airway Mallampati: III  TM Distance: >3 FB Neck ROM: Full    Dental  (+) Teeth Intact, Dental Advisory Given   Pulmonary sleep apnea ,    Pulmonary exam normal breath sounds clear to auscultation       Cardiovascular hypertension, Pt. on medications Normal cardiovascular exam Rhythm:Regular Rate:Normal     Neuro/Psych  Headaches, negative psych ROS   GI/Hepatic GERD  Medicated and Controlled,Biliary colic   Endo/Other  Obesity   Renal/GU negative Renal ROS     Musculoskeletal negative musculoskeletal ROS (+)   Abdominal   Peds  Hematology negative hematology ROS (+)   Anesthesia Other Findings Day of surgery medications reviewed with the patient.  Reproductive/Obstetrics negative OB ROS                            Anesthesia Physical Anesthesia Plan  ASA: 2  Anesthesia Plan: General   Post-op Pain Management: Tylenol PO (pre-op)* and Toradol IV (intra-op)*   Induction: Intravenous  PONV Risk Score and Plan: 4 or greater and Midazolam, Dexamethasone and Ondansetron  Airway Management Planned: Oral ETT  Additional Equipment:   Intra-op Plan:   Post-operative Plan: Extubation in OR  Informed Consent: I have reviewed the patients History and Physical, chart, labs and discussed the procedure including the risks, benefits and alternatives for the proposed anesthesia with the patient or authorized representative who has indicated his/her understanding and acceptance.     Dental advisory given  Plan Discussed with: CRNA  Anesthesia Plan Comments:        Anesthesia Quick Evaluation

## 2021-09-03 NOTE — H&P (Signed)
[name] is an 41 y.o. female.   Chief Complaint: abdominal pain HPI: [name] is a 41 y.o. female who was referred to me for evaluation of RUQ pain. About 2 weeks ago she developed pain in the RUQ that was relatively sudden in onset. She has never previously had pain like this. She is on a PPI and reports she had an EGD at [facility] in 2022. She was not told she has ulcer disease (I am unable to access the report).  She was seen in urgent care last week at [facility] and had a non-con CT, then followed up in GI clinic and was told she had rib inflammation. Her pain later acutely worsened, prompting her to go to the [facility] ED on 8/13. A CT scan at that time showed no acute abnormalities, and her LFTs and lipase were normal. She then had an outpatient HIDA done in the [facility] system on 8/14, which showed a gallbladder EF of 85%, with normal excretion of tracer into the GI tract. She then saw me in the office on 8/16 and was referred for a RUQ [place], which did not show any stones. She has continued to have severe postprandial RUQ pain and has not been able to eat much. We discussed proceeding with cholecystectomy even though gallbladder imaging has been normal, as no other cause of pain has been identified and her symptoms are consistent with biliary colic. She also followed up with her gastroenterologist last week.   Past Medical History:  Diagnosis Date   Headache(784.0)    Hypertension    Infertility associated with anovulation    PP care, s/p C/S 12/24 12/27/2010    Past Surgical History:  Procedure Laterality Date   CESAREAN SECTION  12/27/2010   Procedure: CESAREAN SECTION;  Surgeon: Elveria Royals;  Location: [facility];  Service: Gynecology;  Laterality: N/A;  Primary Cesarian Section with Bilateral Tubal Ligations; Twin Birth   partial amputation of rt great toe     TONSILLECTOMY      History reviewed. No pertinent family history. Social History:  reports that she has never smoked. She  has never used smokeless tobacco. She reports that she does not drink alcohol and does not use drugs.  Allergies:  Allergies  Allergen Reactions   Tramadol Nausea And Vomiting    Medications Prior to Admission  Medication Sig Dispense Refill   acetaminophen (TYLENOL) 500 MG tablet Take 500 mg by mouth every 6 (six) hours as needed for mild pain.     ibuprofen (ADVIL) 200 MG tablet Take 400 mg by mouth every 6 (six) hours as needed for mild pain.     lisinopril-hydrochlorothiazide (ZESTORETIC) 20-25 MG tablet Take 1 tablet by mouth daily.     Multiple Vitamin (MULTIVITAMIN) capsule Take 1 capsule by mouth daily.     pantoprazole (PROTONIX) 40 MG tablet Take 40 mg by mouth daily.     pravastatin (PRAVACHOL) 10 MG tablet Take 10 mg by mouth daily.     SUMAtriptan (IMITREX) 25 MG tablet Take 25 mg by mouth as needed for migraine. May repeat in 2 hours if headache persists or recurs.      No results found for this or any previous visit (from the past 48 hour(s)). No results found.  Review of Systems  Blood pressure (!) 158/96, pulse 60, temperature 98.1 F (36.7 C), temperature source Oral, resp. rate 18, last menstrual period 08/02/2021, SpO2 97 %, unknown if currently breastfeeding. Physical Exam Vitals reviewed.  Constitutional:      General: She is not in acute distress.    Appearance: Normal appearance.  HENT:     Head: Normocephalic and atraumatic.  Eyes:     General: No scleral icterus.    Conjunctiva/sclera: Conjunctivae normal.  Pulmonary:     Effort: Pulmonary effort is normal. No respiratory distress.  Abdominal:     General: There is no distension.     Palpations: Abdomen is soft.     Tenderness: There is no abdominal tenderness.  Musculoskeletal:        General: Normal range of motion.  Skin:    General: Skin is warm and dry.     Coloration: Skin is not jaundiced.  Neurological:     General: No focal deficit present.     Mental Status: She is alert and  oriented to person, place, and time.  Psychiatric:        Mood and Affect: Mood normal.        Behavior: Behavior normal.      Assessment/Plan This is a 41 yo female with persistent post-prandial RUQ abdominal pain. Gallbladder imaging has overall been normal, although there is possibly some sludge on [place]. Workup has not shown any other etiology and the patient's symptoms are severe and very typical of biliary colic. Thus after a discussion she has agreed to proceed with cholecystectomy. The procedure details were reviewed and informed consent was obtained. Proceed to OR for laparoscopic cholecystectomy, plan for discharge home from PACU.  Dwan Bolt, MD 09/03/2021, 8:48 AM

## 2021-09-03 NOTE — Transfer of Care (Signed)
Immediate Anesthesia Transfer of Care Note  Patient: [name]  Procedure(s) Performed: LAPAROSCOPIC CHOLECYSTECTOMY  Patient Location: PACU  Anesthesia Type:General  Level of Consciousness: awake, alert  and patient cooperative  Airway & Oxygen Therapy: Patient Spontanous Breathing and Patient connected to face mask oxygen  Post-op Assessment: Report given to RN  Post vital signs: Reviewed and stable  Last Vitals:  Vitals Value Taken Time  BP    Temp    Pulse 82 09/03/21 1019  Resp 26 09/03/21 1019  SpO2 100 % 09/03/21 1019  Vitals shown include unvalidated device data.  Last Pain:  Vitals:   09/03/21 0830  TempSrc:   PainSc: 0-No pain         Complications: No notable events documented.

## 2021-09-03 NOTE — Anesthesia Postprocedure Evaluation (Signed)
Anesthesia Post Note  Patient: [name]  Procedure(s) Performed: LAPAROSCOPIC CHOLECYSTECTOMY     Patient location during evaluation: PACU Anesthesia Type: General Level of consciousness: awake and alert Pain management: pain level controlled Vital Signs Assessment: post-procedure vital signs reviewed and stable Respiratory status: spontaneous breathing, nonlabored ventilation, respiratory function stable and patient connected to nasal cannula oxygen Cardiovascular status: blood pressure returned to baseline and stable Postop Assessment: no apparent nausea or vomiting Anesthetic complications: no   No notable events documented.  Last Vitals:  Vitals:   09/03/21 1130 09/03/21 1145  BP: (!) 142/98 (!) 131/90  Pulse: 76 76  Resp: 18   Temp: (!) 36.3 C   SpO2: 96% 96%    Last Pain:  Vitals:   09/03/21 1130  TempSrc:   PainSc: [place]

## 2021-09-04 ENCOUNTER — Encounter (HOSPITAL_COMMUNITY): Payer: Self-pay | Admitting: Surgery

## 2021-09-07 LAB — SURGICAL PATHOLOGY

## 2021-09-09 DIAGNOSIS — G4733 Obstructive sleep apnea (adult) (pediatric): Secondary | ICD-10-CM | POA: Diagnosis not present

## 2021-09-14 DIAGNOSIS — D229 Melanocytic nevi, unspecified: Secondary | ICD-10-CM | POA: Diagnosis not present

## 2021-09-14 DIAGNOSIS — D225 Melanocytic nevi of trunk: Secondary | ICD-10-CM | POA: Diagnosis not present

## 2021-09-14 DIAGNOSIS — D2261 Melanocytic nevi of right upper limb, including shoulder: Secondary | ICD-10-CM | POA: Diagnosis not present

## 2021-09-14 DIAGNOSIS — D2262 Melanocytic nevi of left upper limb, including shoulder: Secondary | ICD-10-CM | POA: Diagnosis not present

## 2021-09-14 DIAGNOSIS — L820 Inflamed seborrheic keratosis: Secondary | ICD-10-CM | POA: Diagnosis not present

## 2021-09-15 DIAGNOSIS — N83202 Unspecified ovarian cyst, left side: Secondary | ICD-10-CM | POA: Diagnosis not present

## 2021-09-26 ENCOUNTER — Encounter: Payer: Self-pay | Admitting: Emergency Medicine

## 2021-09-26 ENCOUNTER — Telehealth: Payer: Self-pay | Admitting: Emergency Medicine

## 2021-09-26 ENCOUNTER — Other Ambulatory Visit: Payer: Self-pay

## 2021-09-26 ENCOUNTER — Ambulatory Visit
Admission: EM | Admit: 2021-09-26 | Discharge: 2021-09-26 | Disposition: A | Discharge status: Home / Self Care | Payer: BC Managed Care – PPO | Attending: Physician Assistant | Admitting: Physician Assistant

## 2021-09-26 DIAGNOSIS — G43909 Migraine, unspecified, not intractable, without status migrainosus: Secondary | ICD-10-CM | POA: Diagnosis not present

## 2021-09-26 MED ORDER — DEXAMETHASONE SODIUM PHOSPHATE 10 MG/ML IJ SOLN
8.0000 mg | Freq: Once | INTRAMUSCULAR | Status: AC
  Administered 2021-09-26: 8 mg via INTRAMUSCULAR

## 2021-09-26 MED ORDER — SUMATRIPTAN SUCCINATE 25 MG PO TABS: 25.0000 mg | ORAL_TABLET | ORAL | 0 refills | Status: AC | PRN

## 2021-09-26 MED ORDER — SUMATRIPTAN SUCCINATE 25 MG PO TABS
25.0000 mg | ORAL_TABLET | ORAL | 0 refills | Status: DC | PRN
  Filled 2021-09-26: qty 10, fill #0

## 2021-09-26 MED ORDER — KETOROLAC TROMETHAMINE 60 MG/2ML IM SOLN
60.0000 mg | Freq: Once | INTRAMUSCULAR | Status: AC
  Administered 2021-09-26: 60 mg via INTRAMUSCULAR

## 2021-09-26 NOTE — ED Provider Notes (Signed)
[facility]    CSN: [id number] Arrival date & time: 09/26/21  1513      History   Chief Complaint Chief Complaint  Patient presents with   Headache    HPI [name] is a 41 y.o. female.   Patient here today for evaluation of headache that is similar to prior migraines. She reports headache started this morning. She ran out of her sumatriptan and ibuprofen and tylenol have not been effective. Headache is located diffusely to head-similar to her typical migraine. She has experienced photosensitivity but no vision changes. She has not had any numbness or tingling. She notes that in the past she has received 2 injections which seems to alleviate pain.   The history is provided by the patient.    Past Medical History:  Diagnosis Date   Headache(784.0)    Hypertension    Infertility associated with anovulation    PP care, s/p C/S 12/24 12/27/2010    Patient Active Problem List   Diagnosis Date Noted   Anemia 01/01/2011   Severe preeclampsia 12/27/2010   PP care, s/p C/S 12/24 12/27/2010   Twin pregnancy, twins dichorionic and diamniotic 12/23/2010    Past Surgical History:  Procedure Laterality Date   CESAREAN SECTION  12/27/2010   Procedure: CESAREAN SECTION;  Surgeon: Elveria Royals;  Location: [facility];  Service: Gynecology;  Laterality: N/A;  Primary Cesarian Section with Bilateral Tubal Ligations; Twin Birth   CHOLECYSTECTOMY N/A 09/03/2021   Procedure: LAPAROSCOPIC CHOLECYSTECTOMY;  Surgeon: Dwan Bolt, MD;  Location: [facility];  Service: General;  Laterality: N/A;   partial amputation of rt great toe     TONSILLECTOMY      OB History     Gravida  2   Para  2   Term  1   Preterm  1   AB  0   Living  3      SAB  0   IAB  0   Ectopic  0   Multiple  1   Live Births  2            Home Medications    Prior to Admission medications   Medication Sig Start Date End Date Taking? Authorizing Provider  acetaminophen (TYLENOL)  500 MG tablet Take 500 mg by mouth every 6 (six) hours as needed for mild pain.    [provider]  ibuprofen (ADVIL) 200 MG tablet Take 400 mg by mouth every 6 (six) hours as needed for mild pain.    [provider]  lisinopril-hydrochlorothiazide (ZESTORETIC) 20-25 MG tablet Take 1 tablet by mouth daily.    [provider]  Multiple Vitamin (MULTIVITAMIN) capsule Take 1 capsule by mouth daily.    [provider]  pantoprazole (PROTONIX) 40 MG tablet Take 40 mg by mouth daily. 08/11/21   [provider]  pravastatin (PRAVACHOL) 10 MG tablet Take 10 mg by mouth daily.    [provider]  SUMAtriptan (IMITREX) 25 MG tablet Take 1 tablet (25 mg total) by mouth as needed for migraine. May repeat in 2 hours if headache persists or recurs. 09/26/21   Francene Finders, PA-C    Family History History reviewed. No pertinent family history.  Social History Social History   Tobacco Use   Smoking status: Never   Smokeless tobacco: Never  Vaping Use   Vaping Use: Never used  Substance Use Topics   Alcohol use: No   Drug use: No  Allergies   Tramadol   Review of Systems Review of Systems  Constitutional:  Negative for chills and fever.  Eyes:  Positive for photophobia. Negative for discharge, redness and visual disturbance.  Gastrointestinal:  Negative for abdominal pain, nausea and vomiting.  Neurological:  Positive for headaches. Negative for facial asymmetry and numbness.     Physical Exam Triage Vital Signs ED Triage Vitals  Enc Vitals Group     BP      Pulse      Resp      Temp      Temp src      SpO2      Weight      Height      Head Circumference      Peak Flow      Pain Score      Pain Loc      Pain Edu?      Excl. in [place]?    No data found.  Updated Vital Signs BP (!) 144/86 (BP Location: Right Arm)   Pulse 70   Temp 97.9 F (36.6 C) (Oral)   Resp 18   LMP 07/29/2021 (Exact Date)   SpO2 96%   Physical  Exam Vitals and nursing note reviewed.  Constitutional:      General: She is not in acute distress.    Appearance: Normal appearance. She is not ill-appearing.  HENT:     Head: Normocephalic and atraumatic.  Eyes:     Extraocular Movements: Extraocular movements intact.     Conjunctiva/sclera: Conjunctivae normal.     Pupils: Pupils are equal, round, and reactive to light.  Cardiovascular:     Rate and Rhythm: Normal rate.  Pulmonary:     Effort: Pulmonary effort is normal.  Neurological:     Mental Status: She is alert.  Psychiatric:        Mood and Affect: Mood normal.        Behavior: Behavior normal.        Thought Content: Thought content normal.      UC Treatments / Results  Labs (all labs ordered are listed, but only abnormal results are displayed) Labs Reviewed - No data to display  EKG   Radiology No results found.  Procedures Procedures (including critical care time)  Medications Ordered in UC Medications  ketorolac (TORADOL) injection 60 mg (60 mg Intramuscular Given 09/26/21 1553)  dexamethasone (DECADRON) injection 8 mg (8 mg Intramuscular Given 09/26/21 1551)    Initial Impression / Assessment and Plan / UC Course  I have reviewed the triage vital signs and the nursing notes.  Pertinent labs & imaging results that were available during my care of the patient were reviewed by me and considered in my medical decision making (see chart for details).    Toradol injection and decadron administered in office as this has been effective for her in the past. Encouraged follow up if no gradual improvement or with any further concerns. Sumatriptan refilled.   Final Clinical Impressions(s) / UC Diagnoses   Final diagnoses:  None   Discharge Instructions   None    ED Prescriptions     Medication Sig Dispense Auth. Provider   SUMAtriptan (IMITREX) 25 MG tablet Take 1 tablet (25 mg total) by mouth as needed for migraine. May repeat in 2 hours if headache  persists or recurs. 10 tablet Francene Finders, PA-C      PDMP not reviewed this encounter.   Francene Finders, PA-C 09/27/21 [phone]

## 2021-09-26 NOTE — ED Notes (Signed)
Rerouted sumatriptan to [facility] pharmacy of choice

## 2021-09-26 NOTE — ED Triage Notes (Addendum)
Ran out of sumatriptan.  Current headache started this morning.  Has a history of migraines.  Denies any other symptoms.  Reports she has taken ibuprofen and tylenol.

## 2021-09-27 ENCOUNTER — Encounter: Payer: Self-pay | Admitting: Physician Assistant

## 2021-09-27 ENCOUNTER — Other Ambulatory Visit (HOSPITAL_COMMUNITY): Payer: Self-pay

## 2021-11-04 DIAGNOSIS — I10 Essential (primary) hypertension: Secondary | ICD-10-CM | POA: Diagnosis not present

## 2021-11-04 DIAGNOSIS — F411 Generalized anxiety disorder: Secondary | ICD-10-CM | POA: Diagnosis not present

## 2021-12-22 DIAGNOSIS — M545 Low back pain, unspecified: Secondary | ICD-10-CM | POA: Diagnosis not present

## 2021-12-22 DIAGNOSIS — M25552 Pain in left hip: Secondary | ICD-10-CM | POA: Diagnosis not present

## 2022-01-18 DIAGNOSIS — F411 Generalized anxiety disorder: Secondary | ICD-10-CM | POA: Diagnosis not present

## 2022-01-21 DIAGNOSIS — G43009 Migraine without aura, not intractable, without status migrainosus: Secondary | ICD-10-CM | POA: Diagnosis not present

## 2022-01-21 DIAGNOSIS — H6123 Impacted cerumen, bilateral: Secondary | ICD-10-CM | POA: Diagnosis not present

## 2022-01-26 DIAGNOSIS — I10 Essential (primary) hypertension: Secondary | ICD-10-CM | POA: Diagnosis not present

## 2022-02-10 DIAGNOSIS — I10 Essential (primary) hypertension: Secondary | ICD-10-CM | POA: Diagnosis not present

## 2022-02-28 DIAGNOSIS — M545 Low back pain, unspecified: Secondary | ICD-10-CM | POA: Diagnosis not present

## 2022-03-01 DIAGNOSIS — R590 Localized enlarged lymph nodes: Secondary | ICD-10-CM | POA: Diagnosis not present

## 2022-03-09 DIAGNOSIS — R92323 Mammographic fibroglandular density, bilateral breasts: Secondary | ICD-10-CM | POA: Diagnosis not present

## 2022-03-09 DIAGNOSIS — Z1231 Encounter for screening mammogram for malignant neoplasm of breast: Secondary | ICD-10-CM | POA: Diagnosis not present

## 2022-03-25 DIAGNOSIS — M4726 Other spondylosis with radiculopathy, lumbar region: Secondary | ICD-10-CM | POA: Diagnosis not present

## 2022-03-25 DIAGNOSIS — M4727 Other spondylosis with radiculopathy, lumbosacral region: Secondary | ICD-10-CM | POA: Diagnosis not present

## 2022-03-25 DIAGNOSIS — M545 Low back pain, unspecified: Secondary | ICD-10-CM | POA: Diagnosis not present

## 2022-04-02 DIAGNOSIS — N281 Cyst of kidney, acquired: Secondary | ICD-10-CM | POA: Diagnosis not present

## 2022-04-20 DIAGNOSIS — R09A2 Foreign body sensation, throat: Secondary | ICD-10-CM | POA: Diagnosis not present

## 2022-05-12 DIAGNOSIS — R070 Pain in throat: Secondary | ICD-10-CM | POA: Diagnosis not present

## 2022-05-12 DIAGNOSIS — H66002 Acute suppurative otitis media without spontaneous rupture of ear drum, left ear: Secondary | ICD-10-CM | POA: Diagnosis not present

## 2022-06-28 DIAGNOSIS — J0140 Acute pansinusitis, unspecified: Secondary | ICD-10-CM | POA: Diagnosis not present

## 2022-08-09 DIAGNOSIS — F411 Generalized anxiety disorder: Secondary | ICD-10-CM | POA: Diagnosis not present

## 2022-09-23 DIAGNOSIS — J0100 Acute maxillary sinusitis, unspecified: Secondary | ICD-10-CM | POA: Diagnosis not present

## 2022-09-27 DIAGNOSIS — R09A2 Foreign body sensation, throat: Secondary | ICD-10-CM | POA: Diagnosis not present

## 2022-09-29 DIAGNOSIS — R519 Headache, unspecified: Secondary | ICD-10-CM | POA: Diagnosis not present

## 2022-09-29 DIAGNOSIS — H6122 Impacted cerumen, left ear: Secondary | ICD-10-CM | POA: Diagnosis not present

## 2022-09-29 DIAGNOSIS — F411 Generalized anxiety disorder: Secondary | ICD-10-CM | POA: Diagnosis not present

## 2022-11-11 DIAGNOSIS — R0602 Shortness of breath: Secondary | ICD-10-CM | POA: Diagnosis not present

## 2022-11-11 DIAGNOSIS — J1100 Influenza due to unidentified influenza virus with unspecified type of pneumonia: Secondary | ICD-10-CM | POA: Diagnosis not present

## 2022-11-12 DIAGNOSIS — R0602 Shortness of breath: Secondary | ICD-10-CM | POA: Diagnosis not present

## 2022-11-15 DIAGNOSIS — R051 Acute cough: Secondary | ICD-10-CM | POA: Diagnosis not present

## 2022-12-21 DIAGNOSIS — Z1329 Encounter for screening for other suspected endocrine disorder: Secondary | ICD-10-CM | POA: Diagnosis not present

## 2022-12-21 DIAGNOSIS — F411 Generalized anxiety disorder: Secondary | ICD-10-CM | POA: Diagnosis not present

## 2022-12-21 DIAGNOSIS — Z13220 Encounter for screening for lipoid disorders: Secondary | ICD-10-CM | POA: Diagnosis not present

## 2022-12-21 DIAGNOSIS — H6123 Impacted cerumen, bilateral: Secondary | ICD-10-CM | POA: Diagnosis not present

## 2022-12-21 DIAGNOSIS — Z0000 Encounter for general adult medical examination without abnormal findings: Secondary | ICD-10-CM | POA: Diagnosis not present

## 2023-02-27 DIAGNOSIS — R590 Localized enlarged lymph nodes: Secondary | ICD-10-CM | POA: Diagnosis not present

## 2023-03-07 DIAGNOSIS — R590 Localized enlarged lymph nodes: Secondary | ICD-10-CM | POA: Diagnosis not present

## 2023-03-07 DIAGNOSIS — R5383 Other fatigue: Secondary | ICD-10-CM | POA: Diagnosis not present

## 2023-03-16 DIAGNOSIS — R590 Localized enlarged lymph nodes: Secondary | ICD-10-CM | POA: Diagnosis not present

## 2023-04-03 ENCOUNTER — Telehealth: Payer: Self-pay

## 2023-04-03 NOTE — Telephone Encounter (Signed)
 Left message on voicemail about appointment on 04/04/23

## 2023-04-04 ENCOUNTER — Inpatient Hospital Stay

## 2023-04-04 ENCOUNTER — Inpatient Hospital Stay: Attending: Hematology and Oncology | Admitting: Hematology and Oncology

## 2023-04-11 DIAGNOSIS — Z1331 Encounter for screening for depression: Secondary | ICD-10-CM | POA: Diagnosis not present

## 2023-04-11 DIAGNOSIS — N939 Abnormal uterine and vaginal bleeding, unspecified: Secondary | ICD-10-CM | POA: Diagnosis not present

## 2023-04-11 DIAGNOSIS — Z1379 Encounter for other screening for genetic and chromosomal anomalies: Secondary | ICD-10-CM | POA: Diagnosis not present

## 2023-04-26 DIAGNOSIS — Z01812 Encounter for preprocedural laboratory examination: Secondary | ICD-10-CM | POA: Diagnosis not present

## 2023-04-26 DIAGNOSIS — N858 Other specified noninflammatory disorders of uterus: Secondary | ICD-10-CM | POA: Diagnosis not present

## 2023-04-26 DIAGNOSIS — N939 Abnormal uterine and vaginal bleeding, unspecified: Secondary | ICD-10-CM | POA: Diagnosis not present

## 2023-05-16 DIAGNOSIS — G43119 Migraine with aura, intractable, without status migrainosus: Secondary | ICD-10-CM | POA: Diagnosis not present

## 2023-05-17 DIAGNOSIS — Z7951 Long term (current) use of inhaled steroids: Secondary | ICD-10-CM | POA: Diagnosis not present

## 2023-05-17 DIAGNOSIS — E669 Obesity, unspecified: Secondary | ICD-10-CM | POA: Diagnosis not present

## 2023-05-17 DIAGNOSIS — N939 Abnormal uterine and vaginal bleeding, unspecified: Secondary | ICD-10-CM | POA: Diagnosis not present

## 2023-05-17 DIAGNOSIS — Z6833 Body mass index (BMI) 33.0-33.9, adult: Secondary | ICD-10-CM | POA: Diagnosis not present

## 2023-05-17 DIAGNOSIS — N858 Other specified noninflammatory disorders of uterus: Secondary | ICD-10-CM | POA: Diagnosis not present

## 2023-05-17 DIAGNOSIS — I10 Essential (primary) hypertension: Secondary | ICD-10-CM | POA: Diagnosis not present

## 2023-05-17 DIAGNOSIS — K219 Gastro-esophageal reflux disease without esophagitis: Secondary | ICD-10-CM | POA: Diagnosis not present

## 2023-05-17 DIAGNOSIS — Z79899 Other long term (current) drug therapy: Secondary | ICD-10-CM | POA: Diagnosis not present

## 2023-06-11 DIAGNOSIS — R9431 Abnormal electrocardiogram [ECG] [EKG]: Secondary | ICD-10-CM | POA: Diagnosis not present

## 2023-06-11 DIAGNOSIS — I10 Essential (primary) hypertension: Secondary | ICD-10-CM | POA: Diagnosis not present

## 2023-06-11 DIAGNOSIS — R519 Headache, unspecified: Secondary | ICD-10-CM | POA: Diagnosis not present

## 2023-06-11 DIAGNOSIS — R072 Precordial pain: Secondary | ICD-10-CM | POA: Diagnosis not present

## 2023-06-11 DIAGNOSIS — R079 Chest pain, unspecified: Secondary | ICD-10-CM | POA: Diagnosis not present

## 2023-06-26 DIAGNOSIS — M545 Low back pain, unspecified: Secondary | ICD-10-CM | POA: Diagnosis not present

## 2023-07-04 ENCOUNTER — Ambulatory Visit: Admitting: Physical Therapy

## 2023-09-13 DIAGNOSIS — E782 Mixed hyperlipidemia: Secondary | ICD-10-CM | POA: Diagnosis not present

## 2023-10-09 ENCOUNTER — Ambulatory Visit
Admission: EM | Admit: 2023-10-09 | Discharge: 2023-10-09 | Disposition: A | Discharge status: Home / Self Care | Attending: Nurse Practitioner | Admitting: Nurse Practitioner

## 2023-10-09 DIAGNOSIS — R509 Fever, unspecified: Secondary | ICD-10-CM | POA: Diagnosis not present

## 2023-10-09 DIAGNOSIS — J069 Acute upper respiratory infection, unspecified: Secondary | ICD-10-CM | POA: Diagnosis not present

## 2023-10-09 LAB — POC COVID19/FLU A&B COMBO
Covid Antigen, POC: NEGATIVE
Influenza A Antigen, POC: NEGATIVE
Influenza B Antigen, POC: NEGATIVE

## 2023-10-09 LAB — POCT RAPID STREP A (OFFICE): Rapid Strep A Screen: NEGATIVE

## 2023-10-09 MED ORDER — BENZONATATE 100 MG PO CAPS: 100.0000 mg | ORAL_CAPSULE | Freq: Three times a day (TID) | ORAL | 0 refills | Status: AC | PRN

## 2023-10-09 MED ORDER — PROMETHAZINE-DM 6.25-15 MG/5ML PO SYRP: 5.0000 mL | ORAL_SOLUTION | Freq: Four times a day (QID) | ORAL | 0 refills | Status: AC | PRN

## 2023-10-09 NOTE — ED Provider Notes (Signed)
 [facility]    CSN: [id number] Arrival date & time: 10/09/23  1031      History   Chief Complaint Chief Complaint  Patient presents with   Ear Pain   Fever   Sore Throat    HPI [name] is a 43 y.o. female.   Patient presents today with 1 day history of fever, chills, nasal congestion, sore throat, bilateral ear pain worse on the right, decreased appetite, and fatigue.  No cough, shortness of breath or chest pain, headache, abdominal pain, nausea/vomiting, or diarrhea.  Has not taken anything for symptoms so far.  She has been able to drink fluids today and keep them down.  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    Past Medical History:  Diagnosis Date   Headache(784.0)    Hypertension    Infertility associated with anovulation    PP care, s/p C/S 12/24 12/27/2010    Patient Active Problem List   Diagnosis Date Noted   Anemia 01/01/2011   Severe preeclampsia 12/27/2010   PP care, s/p C/S 12/24 12/27/2010   Twin pregnancy, twins dichorionic and diamniotic 12/23/2010    Past Surgical History:  Procedure Laterality Date   CESAREAN SECTION  12/27/2010   Procedure: CESAREAN SECTION;  Surgeon: Robbi JONELLE Render;  Location: [facility];  Service: Gynecology;  Laterality: N/A;  Primary Cesarian Section with Bilateral Tubal Ligations; Twin Birth   CHOLECYSTECTOMY N/A 09/03/2021   Procedure: LAPAROSCOPIC CHOLECYSTECTOMY;  Surgeon: Dasie Leonor CROME, MD;  Location: [facility];  Service: General;  Laterality: N/A;   partial amputation of rt great toe     TONSILLECTOMY      OB History     Gravida  2   Para  2   Term  1   Preterm  1   AB  0   Living  3      SAB  0   IAB  0   Ectopic  0   Multiple  1   Live Births  2            Home Medications    Prior to Admission medications   Medication Sig Start Date End Date Taking? Authorizing Provider  benzonatate (TESSALON) 100 MG capsule Take 1 capsule  (100 mg total) by mouth 3 (three) times daily as needed for cough. Do not take with alcohol or while operating or driving heavy machinery 89/3/74  Yes Chandra Harlene LABOR, NP  promethazine -dextromethorphan (PROMETHAZINE -DM) 6.25-15 MG/5ML syrup Take 5 mLs by mouth 4 (four) times daily as needed for cough. Do not take with alcohol or while driving or operating heavy machinery.  May cause drowsiness. 10/09/23  Yes Chandra Harlene LABOR, NP  acetaminophen  (TYLENOL ) 500 MG tablet Take 500 mg by mouth every 6 (six) hours as needed for mild pain.    [provider]  ibuprofen  (ADVIL ) 200 MG tablet Take 400 mg by mouth every 6 (six) hours as needed for mild pain.    [provider]  lisinopril-hydrochlorothiazide  (ZESTORETIC) 20-25 MG tablet Take 1 tablet by mouth daily.    [provider]  Multiple Vitamin (MULTIVITAMIN) capsule Take 1 capsule by mouth daily.    [provider]  pantoprazole (PROTONIX) 40 MG tablet Take 40 mg by mouth daily. 08/11/21   [provider]  pravastatin (PRAVACHOL) 10 MG tablet Take 10 mg by mouth daily.    [provider]  SUMAtriptan  (IMITREX ) 25 MG  tablet Take 1 tablet (25 mg total) by mouth as needed for migraine. May repeat in 2 hours if headache persists or recurs. 09/26/21   Billy Asberry FALCON, PA-C    Family History History reviewed. No pertinent family history.  Social History Social History   Tobacco Use   Smoking status: Never   Smokeless tobacco: Never  Vaping Use   Vaping status: Never Used  Substance Use Topics   Alcohol use: No   Drug use: No     Allergies   Tramadol   Review of Systems Review of Systems Per HPI  Physical Exam Triage Vital Signs ED Triage Vitals  Encounter Vitals Group     BP 10/09/23 1057 118/84     Girls Systolic BP Percentile --      Girls Diastolic BP Percentile --      Boys Systolic BP Percentile --      Boys Diastolic BP Percentile --      Pulse Rate 10/09/23 1057 70      Resp --      Temp 10/09/23 1057 98.8 F (37.1 C)     Temp src --      SpO2 10/09/23 1057 96 %     Weight --      Height --      Head Circumference --      Peak Flow --      Pain Score 10/09/23 1056 4     Pain Loc --      Pain Education --      Exclude from Growth Chart --    No data found.  Updated Vital Signs BP 118/84   Pulse 70   Temp 98.8 F (37.1 C)   SpO2 96%   Visual Acuity Right Eye Distance:   Left Eye Distance:   Bilateral Distance:    Right Eye Near:   Left Eye Near:    Bilateral Near:     Physical Exam Vitals and nursing note reviewed.  Constitutional:      General: She is not in acute distress.    Appearance: Normal appearance. She is not ill-appearing or toxic-appearing.  HENT:     Head: Normocephalic and atraumatic.     Right Ear: Tympanic membrane, ear canal and external ear normal. No drainage, swelling or tenderness. No middle ear effusion. Tympanic membrane is not erythematous.     Left Ear: Tympanic membrane, ear canal and external ear normal. No drainage, swelling or tenderness.  No middle ear effusion. Tympanic membrane is not erythematous.     Nose: No congestion or rhinorrhea.     Mouth/Throat:     Mouth: Mucous membranes are moist.     Pharynx: Oropharynx is clear. Postnasal drip present. No oropharyngeal exudate or posterior oropharyngeal erythema.     Tonsils: No tonsillar exudate.  Eyes:     General: No scleral icterus.    Extraocular Movements: Extraocular movements intact.  Cardiovascular:     Rate and Rhythm: Normal rate and regular rhythm.  Pulmonary:     Effort: Pulmonary effort is normal. No respiratory distress.     Breath sounds: Normal breath sounds. No wheezing, rhonchi or rales.  Musculoskeletal:     Cervical back: Normal range of motion and neck supple.  Lymphadenopathy:     Cervical: No cervical adenopathy.  Skin:    General: Skin is warm and dry.     Coloration: Skin is not jaundiced or pale.     Findings: No  erythema or rash.  Neurological:     Mental Status: She is alert and oriented to person, place, and time.  Psychiatric:        Behavior: Behavior is cooperative.      UC Treatments / Results  Labs (all labs ordered are listed, but only abnormal results are displayed) Labs Reviewed  POCT RAPID STREP A (OFFICE)  POC COVID19/FLU A&B COMBO    EKG   Radiology No results found.  Procedures Procedures (including critical care time)  Medications Ordered in UC Medications - No data to display  Initial Impression / Assessment and Plan / UC Course  I have reviewed the triage vital signs and the nursing notes.  Pertinent labs & imaging results that were available during my care of the patient were reviewed by me and considered in my medical decision making (see chart for details).   Patient is well-appearing, normotensive, afebrile, not tachycardic, not tachypneic, oxygenating well on room air.   1. Fever, unspecified 2. Viral URI Suspect viral etiology Vitals exam reassuring today COVID-19, influenza strep testing is negative Supportive care discussed with patient Cough suppressant medication sent to pharmacy Return and ER precautions discussed  The patient was given the opportunity to ask questions.  All questions answered to their satisfaction.  The patient is in agreement to this plan.   Final Clinical Impressions(s) / UC Diagnoses   Final diagnoses:  Fever, unspecified  Viral URI     Discharge Instructions      You have a viral upper respiratory infection.  Symptoms should improve over the next week to 10 days.  If you develop chest pain or shortness of breath, go to the emergency room.  COVID-19, influenza, strep throat test are negative.  Some things that can make you feel better are: - Increased rest - Increasing fluid with water/sugar free electrolytes - Acetaminophen  and ibuprofen  as needed for fever/pain - Salt water gargling, chloraseptic spray and  throat lozenges - OTC guaifenesin (Mucinex) 600 mg twice daily - Saline sinus flushes or a neti pot - Humidifying the air -Tessalon Perles every 8 hours as needed for dry cough and cough syrup at night time as needed     ED Prescriptions     Medication Sig Dispense Auth. Provider   benzonatate (TESSALON) 100 MG capsule Take 1 capsule (100 mg total) by mouth 3 (three) times daily as needed for cough. Do not take with alcohol or while operating or driving heavy machinery 21 capsule Chandra Raisin A, NP   promethazine -dextromethorphan (PROMETHAZINE -DM) 6.25-15 MG/5ML syrup Take 5 mLs by mouth 4 (four) times daily as needed for cough. Do not take with alcohol or while driving or operating heavy machinery.  May cause drowsiness. 118 mL Chandra Raisin LABOR, NP      PDMP not reviewed this encounter.   Chandra Raisin LABOR, NP 10/09/23 1149

## 2023-10-09 NOTE — ED Triage Notes (Signed)
 Pt present with c/o fever since last night and sore throat. Pt also has bilateral ear pain. Pt states she also has sinus pressure. Denies cough and nasal congestion. Denies body aches.   Home interventions: Tylenol  and Ibuprofen  - last dose was last night.

## 2023-10-09 NOTE — Discharge Instructions (Addendum)
 You have a viral upper respiratory infection.  Symptoms should improve over the next week to 10 days.  If you develop chest pain or shortness of breath, go to the emergency room.  COVID-19, influenza, strep throat test are negative.  Some things that can make you feel better are: - Increased rest - Increasing fluid with water/sugar free electrolytes - Acetaminophen  and ibuprofen  as needed for fever/pain - Salt water gargling, chloraseptic spray and throat lozenges - OTC guaifenesin (Mucinex) 600 mg twice daily - Saline sinus flushes or a neti pot - Humidifying the air -Tessalon Perles every 8 hours as needed for dry cough and cough syrup at night time as needed

## 2023-10-10 DIAGNOSIS — H66001 Acute suppurative otitis media without spontaneous rupture of ear drum, right ear: Secondary | ICD-10-CM | POA: Diagnosis not present

## 2024-01-01 ENCOUNTER — Ambulatory Visit
Admission: EM | Admit: 2024-01-01 | Discharge: 2024-01-01 | Disposition: A | Discharge status: Home / Self Care | Attending: Nurse Practitioner | Admitting: Nurse Practitioner

## 2024-01-01 DIAGNOSIS — G43911 Migraine, unspecified, intractable, with status migrainosus: Secondary | ICD-10-CM

## 2024-01-01 DIAGNOSIS — Z8669 Personal history of other diseases of the nervous system and sense organs: Secondary | ICD-10-CM

## 2024-01-01 MED ORDER — DEXAMETHASONE SOD PHOSPHATE PF 10 MG/ML IJ SOLN
10.0000 mg | Freq: Once | INTRAMUSCULAR | Status: AC
  Administered 2024-01-01: 10 mg via INTRAMUSCULAR

## 2024-01-01 MED ORDER — KETOROLAC TROMETHAMINE 30 MG/ML IJ SOLN
30.0000 mg | Freq: Once | INTRAMUSCULAR | Status: AC
  Administered 2024-01-01: 30 mg via INTRAMUSCULAR

## 2024-01-01 NOTE — ED Provider Notes (Signed)
 " [facility]    CSN: [id number] Arrival date & time: 01/01/24  0930      History   Chief Complaint No chief complaint on file.   HPI [name] is a 43 y.o. female.   The history is provided by the patient.   Patient presents for complaints of migraine has been present for the past 3 days.  Patient also endorses nausea.  Denies sound sensitivity, dizziness, blurred vision, vomiting, slurred speech, or weakness.  States that the headache pain is behind the right eye and radiates down into the right neck and shoulder.  Patient reports that she does have a long history of migraines.  States that she has been under more stress than usual worried about illness in her family, also states that she may be nearing the time of her cycle.  States that she did try Imitrex  prescribed by her PCP, but that did not help. Past Medical History:  Diagnosis Date   Headache(784.0)    Hypertension    Infertility associated with anovulation    PP care, s/p C/S 12/24 12/27/2010    Patient Active Problem List   Diagnosis Date Noted   Anemia 01/01/2011   Severe preeclampsia 12/27/2010   PP care, s/p C/S 12/24 12/27/2010   Twin pregnancy, twins dichorionic and diamniotic 12/23/2010    Past Surgical History:  Procedure Laterality Date   CESAREAN SECTION  12/27/2010   Procedure: CESAREAN SECTION;  Surgeon: Robbi JONELLE Render;  Location: [facility];  Service: Gynecology;  Laterality: N/A;  Primary Cesarian Section with Bilateral Tubal Ligations; Twin Birth   CHOLECYSTECTOMY N/A 09/03/2021   Procedure: LAPAROSCOPIC CHOLECYSTECTOMY;  Surgeon: Dasie Leonor CROME, MD;  Location: [facility];  Service: General;  Laterality: N/A;   partial amputation of rt great toe     TONSILLECTOMY      OB History     Gravida  2   Para  2   Term  1   Preterm  1   AB  0   Living  3      SAB  0   IAB  0   Ectopic  0   Multiple  1   Live Births  2            Home Medications    Prior to  Admission medications  Medication Sig Start Date End Date Taking? Authorizing Provider  acetaminophen  (TYLENOL ) 500 MG tablet Take 500 mg by mouth every 6 (six) hours as needed for mild pain.    [provider]  benzonatate  (TESSALON ) 100 MG capsule Take 1 capsule (100 mg total) by mouth 3 (three) times daily as needed for cough. Do not take with alcohol or while operating or driving heavy machinery 89/3/74   Chandra Harlene LABOR, NP  ibuprofen  (ADVIL ) 200 MG tablet Take 400 mg by mouth every 6 (six) hours as needed for mild pain.    [provider]  lisinopril-hydrochlorothiazide  (ZESTORETIC) 20-25 MG tablet Take 1 tablet by mouth daily.    [provider]  Multiple Vitamin (MULTIVITAMIN) capsule Take 1 capsule by mouth daily.    [provider]  pantoprazole (PROTONIX) 40 MG tablet Take 40 mg by mouth daily. 08/11/21   [provider]  pravastatin (PRAVACHOL) 10 MG tablet Take 10 mg by mouth daily.    [provider]  promethazine -dextromethorphan (PROMETHAZINE -DM) 6.25-15 MG/5ML syrup Take 5 mLs by mouth 4 (four) times daily as needed for cough. Do not take with alcohol  or while driving or operating heavy machinery.  May cause drowsiness. 10/09/23   Chandra Harlene LABOR, NP  SUMAtriptan  (IMITREX ) 25 MG tablet Take 1 tablet (25 mg total) by mouth as needed for migraine. May repeat in 2 hours if headache persists or recurs. 09/26/21   Billy Asberry FALCON, PA-C    Family History History reviewed. No pertinent family history.  Social History Social History[1]   Allergies   Tramadol   Review of Systems Review of Systems Per HPI  Physical Exam Triage Vital Signs ED Triage Vitals  Encounter Vitals Group     BP 01/01/24 1118 (!) 155/92     Girls Systolic BP Percentile --      Girls Diastolic BP Percentile --      Boys Systolic BP Percentile --      Boys Diastolic BP Percentile --      Pulse Rate 01/01/24 1118 69     Resp 01/01/24 1118 16      Temp 01/01/24 1118 98.4 F (36.9 C)     Temp Source 01/01/24 1118 Oral     SpO2 01/01/24 1118 96 %     Weight --      Height --      Head Circumference --      Peak Flow --      Pain Score 01/01/24 1119 10     Pain Loc --      Pain Education --      Exclude from Growth Chart --    No data found.  Updated Vital Signs BP (!) 155/92 (BP Location: Right Arm)   Pulse 69   Temp 98.4 F (36.9 C) (Oral)   Resp 16   SpO2 96%   Breastfeeding No   Visual Acuity Right Eye Distance:   Left Eye Distance:   Bilateral Distance:    Right Eye Near:   Left Eye Near:    Bilateral Near:     Physical Exam Vitals and nursing note reviewed.  Constitutional:      General: She is not in acute distress.    Appearance: Normal appearance.  HENT:     Head: Normocephalic.     Right Ear: Tympanic membrane, ear canal and external ear normal.     Left Ear: Tympanic membrane, ear canal and external ear normal.     Nose: Nose normal.     Mouth/Throat:     Mouth: Mucous membranes are moist.  Eyes:     Extraocular Movements: Extraocular movements intact.     Conjunctiva/sclera: Conjunctivae normal.     Pupils: Pupils are equal, round, and reactive to light.  Cardiovascular:     Rate and Rhythm: Normal rate and regular rhythm.     Pulses: Normal pulses.     Heart sounds: Normal heart sounds.  Pulmonary:     Effort: Pulmonary effort is normal. No respiratory distress.     Breath sounds: Normal breath sounds. No stridor. No wheezing, rhonchi or rales.  Abdominal:     General: Bowel sounds are normal.     Palpations: Abdomen is soft.  Musculoskeletal:     Cervical back: Normal range of motion.  Lymphadenopathy:     Cervical: No cervical adenopathy.  Skin:    General: Skin is warm and dry.  Neurological:     General: No focal deficit present.     Mental Status: She is alert and oriented to person, place, and time.     GCS: GCS eye subscore is 4. GCS  verbal subscore is 5. GCS motor  subscore is 6.     Cranial Nerves: Cranial nerves 2-12 are intact.     Sensory: Sensation is intact.     Motor: Motor function is intact.     Coordination: Coordination is intact.     Gait: Gait is intact.  Psychiatric:        Mood and Affect: Mood normal.        Behavior: Behavior normal.      UC Treatments / Results  Labs (all labs ordered are listed, but only abnormal results are displayed) Labs Reviewed - No data to display  EKG   Radiology No results found.  Procedures Procedures (including critical care time)  Medications Ordered in UC Medications  ketorolac  (TORADOL ) 30 MG/ML injection 30 mg (has no administration in time range)  dexamethasone  (DECADRON ) injection 10 mg (has no administration in time range)    Initial Impression / Assessment and Plan / UC Course  I have reviewed the triage vital signs and the nursing notes.  Pertinent labs & imaging results that were available during my care of the patient were reviewed by me and considered in my medical decision making (see chart for details).  Patient presents for complaints of migraine headache that is been present for the past 3 days.  On exam, her neurological exam is within normal limits, no deficits noted on exam.  Symptoms are consistent with a migraine headache.  Decadron  10 mg IM and Toradol  30 mg IM administered for headache pain.  Supportive care recommendations were provided and discussed with the patient to include sleeping in a dimly lit room, cool cloths across her forehead, and to follow-up with her PCP if Imitrex  is no longer effective.  Patient was also given strict ER follow-up precautions.  Patient was in agreement with this plan of care and verbalizes understanding.  All questions were answered.  Patient stable for discharge.  Final Clinical Impressions(s) / UC Diagnoses   Final diagnoses:  None   Discharge Instructions   None    ED Prescriptions   None    PDMP not reviewed this  encounter.    [1]  Social History Tobacco Use   Smoking status: Never   Smokeless tobacco: Never  Vaping Use   Vaping status: Never Used  Substance Use Topics   Alcohol use: No   Drug use: No     Gilmer Etta PARAS, NP 01/01/24 1131  "

## 2024-01-01 NOTE — Discharge Instructions (Signed)
 You were given injections of Decadron  10 mg and Toradol  30 mg.  Do not take any additional NSAIDs today to include ibuprofen , Aleve, Motrin , naproxen, or Advil .  You may take Tylenol  for breakthrough pain or discomfort. Increase fluids and allow for plenty of rest. Avoid triggers that may worsen your headache. Recommend sleeping in a dimly lit room or using a cool cloth across your forehead while symptoms persist. Recommend follow-up with your primary care physician for further evaluation if your current treatment is not working. Go to the emergency department if you experience worsening headache, dizziness, slurred speech, visual changes, or other concerns. Follow-up as needed.

## 2024-01-01 NOTE — ED Triage Notes (Signed)
 Pt reports headache x 3 days has used Imitrex  but has found no relief.
